# Patient Record
Sex: Female | Born: 2000 | Race: Black or African American | Hispanic: No | Marital: Single | Smoking: Former smoker
Health system: Southern US, Community
[De-identification: ages and names within clinical notes are randomized; demographics above are authoritative.]

## PROBLEM LIST (undated history)

## (undated) DIAGNOSIS — Z8619 Personal history of other infectious and parasitic diseases: Secondary | ICD-10-CM

## (undated) DIAGNOSIS — J45909 Unspecified asthma, uncomplicated: Secondary | ICD-10-CM

## (undated) DIAGNOSIS — A599 Trichomoniasis, unspecified: Secondary | ICD-10-CM

## (undated) DIAGNOSIS — J9801 Acute bronchospasm: Secondary | ICD-10-CM

## (undated) HISTORY — DX: Trichomoniasis, unspecified: A59.9

## (undated) HISTORY — DX: Acute bronchospasm: J98.01

## (undated) HISTORY — DX: Unspecified asthma, uncomplicated: J45.909

## (undated) HISTORY — DX: Personal history of other infectious and parasitic diseases: Z86.19

## (undated) HISTORY — PX: NO PAST SURGERIES: SHX2092

---
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
## 2020-01-01 ENCOUNTER — Ambulatory Visit (INDEPENDENT_AMBULATORY_CARE_PROVIDER_SITE_OTHER): Payer: BC Managed Care – PPO | Admitting: Obstetrics & Gynecology

## 2020-01-01 ENCOUNTER — Other Ambulatory Visit: Payer: Self-pay

## 2020-01-01 ENCOUNTER — Encounter: Payer: Self-pay | Admitting: Obstetrics & Gynecology

## 2020-01-01 ENCOUNTER — Ambulatory Visit (INDEPENDENT_AMBULATORY_CARE_PROVIDER_SITE_OTHER): Payer: BC Managed Care – PPO

## 2020-01-01 VITALS — BP 109/63 | HR 77 | Wt 109.0 lb

## 2020-01-01 DIAGNOSIS — Z3A32 32 weeks gestation of pregnancy: Secondary | ICD-10-CM

## 2020-01-01 DIAGNOSIS — Z3480 Encounter for supervision of other normal pregnancy, unspecified trimester: Secondary | ICD-10-CM

## 2020-01-01 DIAGNOSIS — O09899 Supervision of other high risk pregnancies, unspecified trimester: Secondary | ICD-10-CM

## 2020-01-01 DIAGNOSIS — O35EXX0 Maternal care for other (suspected) fetal abnormality and damage, fetal genitourinary anomalies, not applicable or unspecified: Secondary | ICD-10-CM

## 2020-01-01 DIAGNOSIS — Z1389 Encounter for screening for other disorder: Secondary | ICD-10-CM

## 2020-01-01 DIAGNOSIS — O358XX0 Maternal care for other (suspected) fetal abnormality and damage, not applicable or unspecified: Secondary | ICD-10-CM | POA: Diagnosis not present

## 2020-01-01 DIAGNOSIS — Z331 Pregnant state, incidental: Secondary | ICD-10-CM

## 2020-01-01 DIAGNOSIS — Z3483 Encounter for supervision of other normal pregnancy, third trimester: Secondary | ICD-10-CM

## 2020-01-01 LAB — POCT URINALYSIS DIPSTICK OB
Blood, UA: NEGATIVE
Glucose, UA: NEGATIVE
Ketones, UA: NEGATIVE
Nitrite, UA: NEGATIVE

## 2020-01-01 NOTE — Progress Notes (Signed)
LOW-RISK PREGNANCY VISIT Patient name: Briana Dominguez MRN [id number]  Date of birth: [birth year] Chief Complaint:   Routine Prenatal Visit  History of Present Illness:   Briana Dominguez is a 19 y.o. G34P1001 female at [redacted]w[redacted]d with an Estimated Date of Delivery: 02/20/20 being seen today for ongoing management of a low-risk pregnancy.  Depression screen [facility] 2/9 11/20/2019 08/16/2019 06/19/2018 04/11/2018  Decreased Interest 0 0 0 0  Down, Depressed, Hopeless 0 0 0 0  PHQ - 2 Score 0 0 0 0  Altered sleeping 0 0 1 -  Tired, decreased energy 1 3 1  -  Change in appetite 0 2 0 -  Feeling bad or failure about yourself  0 0 0 -  Trouble concentrating 0 0 0 -  Moving slowly or fidgety/restless 0 0 0 -  Suicidal thoughts 0 0 0 -  PHQ-9 Score 1 5 2  -  Difficult doing work/chores - - Not difficult at all -    Today she reports no complaints. Contractions: Not present. Vag. Bleeding: None.  Movement: Present. denies leaking of fluid. Review of Systems:   Pertinent items are noted in HPI Denies abnormal vaginal discharge w/ itching/odor/irritation, headaches, visual changes, shortness of breath, chest pain, abdominal pain, severe nausea/vomiting, or problems with urination or bowel movements unless otherwise stated above. Pertinent History Reviewed:  Reviewed past medical,surgical, social, obstetrical and family history.  Reviewed problem list, medications and allergies. Physical Assessment:   Vitals:   01/01/20 1635  BP: 109/63  Pulse: 77  Weight: 109 lb (49.4 kg)  Body mass index is 20.6 kg/m.        Physical Examination:   General appearance: Well appearing, and in no distress  Mental status: Alert, oriented to person, place, and time  Skin: Warm & dry  Cardiovascular: Normal heart rate noted  Respiratory: Normal respiratory effort, no distress  Abdomen: Soft, gravid, nontender  Pelvic: Cervical exam deferred         Extremities: Edema: None  Fetal Status:     Movement: Present     Chaperone: n/a    Results for orders placed or performed in visit on 01/01/20 (from the past 24 hour(s))  POC Urinalysis Dipstick OB   Collection Time: 01/01/20  4:40 PM  Result Value Ref Range   Color, UA     Clarity, UA     Glucose, UA Negative Negative   Bilirubin, UA     Ketones, UA negative    Spec Grav, UA     Blood, UA negative    pH, UA     POC,PROTEIN,UA Trace Negative, Trace, Small (1+), Moderate (2+), Large (3+), 4+   Urobilinogen, UA     Nitrite, UA neg    Leukocytes, UA Trace (A) Negative   Appearance     Odor      Assessment & Plan:  1) Low-risk pregnancy G2P1001 at [redacted]w[redacted]d with an Estimated Date of Delivery: 02/20/20   2) Duplicated right kidney, 1 ureter, mild dialtion with hydronephrosis, 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  Labs/procedures today: sonogram  Plan:  Continue routine obstetrical care  Next visit: prefers in person    Reviewed: Preterm labor symptoms and general obstetric precautions including but not limited to vaginal bleeding, contractions, leaking of fluid and fetal movement were reviewed in detail with the patient.  All questions were answered. Has home bp cuff. Rx faxed to . Check bp weekly, let [redacted]w[redacted]d know  if >140/90.   Follow-up: Return in about 3 weeks (around 01/22/2020) for LROB.  Orders Placed This Encounter  Procedures  . POC Urinalysis Dipstick OB    Lazaro Arms, MD 01/01/2020 5:13 PM

## 2020-01-01 NOTE — Progress Notes (Signed)
[name] 32+6 wks,cephalic,posterior placenta gr 1,duplicated right kidney with a dilated upper pole ureter 6 mm,no hydronephrosis,normal left kidney,fhr 137 bpm,AFI 11.6 cm,EFW 1967 g 28%

## 2020-01-23 ENCOUNTER — Ambulatory Visit (INDEPENDENT_AMBULATORY_CARE_PROVIDER_SITE_OTHER): Payer: BC Managed Care – PPO | Admitting: Medical

## 2020-01-23 ENCOUNTER — Other Ambulatory Visit: Payer: Self-pay

## 2020-01-23 ENCOUNTER — Other Ambulatory Visit (HOSPITAL_COMMUNITY)
Admission: RE | Admit: 2020-01-23 | Discharge: 2020-01-23 | Disposition: A | Discharge status: Home / Self Care | Payer: BC Managed Care – PPO | Source: Ambulatory Visit | Attending: Medical | Admitting: Medical

## 2020-01-23 VITALS — BP 118/54 | HR 80 | Wt 115.0 lb

## 2020-01-23 DIAGNOSIS — Z1389 Encounter for screening for other disorder: Secondary | ICD-10-CM

## 2020-01-23 DIAGNOSIS — O09899 Supervision of other high risk pregnancies, unspecified trimester: Secondary | ICD-10-CM

## 2020-01-23 DIAGNOSIS — Z3483 Encounter for supervision of other normal pregnancy, third trimester: Secondary | ICD-10-CM

## 2020-01-23 DIAGNOSIS — Z3A36 36 weeks gestation of pregnancy: Secondary | ICD-10-CM | POA: Insufficient documentation

## 2020-01-23 DIAGNOSIS — Z3480 Encounter for supervision of other normal pregnancy, unspecified trimester: Secondary | ICD-10-CM

## 2020-01-23 LAB — POCT URINALYSIS DIPSTICK OB
Blood, UA: NEGATIVE
Glucose, UA: NEGATIVE
Ketones, UA: NEGATIVE
Leukocytes, UA: NEGATIVE
Nitrite, UA: NEGATIVE
POC,PROTEIN,UA: NEGATIVE

## 2020-01-23 NOTE — Progress Notes (Signed)
   PRENATAL VISIT NOTE  Subjective:  Briana Dominguez is a 20 y.o. G2P1001 at [redacted]w[redacted]d being seen today for ongoing prenatal care.  She is currently monitored for the following issues for this low-risk pregnancy and has Vitamin D deficiency; Low vitamin B12 level; Gonorrhea; Encounter for supervision of normal pregnancy, antepartum; and Short interval between pregnancies affecting pregnancy, antepartum on their problem list.  Patient reports occasional contractions.  Contractions: Irregular. Vag. Bleeding: None.  Movement: Present. Denies leaking of fluid.   The following portions of the patient's history were reviewed and updated as appropriate: allergies, current medications, past family history, past medical history, past social history, past surgical history and problem list.   Objective:   Vitals:   01/23/20 0912  BP: (!) 118/54  Pulse: 80  Weight: 115 lb (52.2 kg)    Fetal Status: Fetal Heart Rate (bpm): 140 Fundal Height: 33 cm Movement: Present  Presentation: Vertex  General:  Alert, oriented and cooperative. Patient is in no acute distress.  Skin: Skin is warm and dry. No rash noted.   Cardiovascular: Normal heart rate noted  Respiratory: Normal respiratory effort, no problems with respiration noted  Abdomen: Soft, gravid, appropriate for gestational age.  Pain/Pressure: Present     Pelvic: Cervical exam performed in the presence of a chaperone Dilation: 3 Effacement (%): 50 Station: -2  Extremities: Normal range of motion.  Edema: None  Mental Status: Normal mood and affect. Normal behavior. Normal judgment and thought content.   Assessment and Plan:  Pregnancy: G2P1001 at [redacted]w[redacted]d 1. Encounter for supervision of other normal pregnancy in third trimester - Cervicovaginal ancillary only( [place]) - Culture, beta strep (group b only)  2. Screening for genitourinary condition - POC Urinalysis Dipstick OB  3. [redacted] weeks gestation of pregnancy  4. Short interval between  pregnancies affecting pregnancy, antepartum  Preterm labor symptoms and general obstetric precautions including but not limited to vaginal bleeding, contractions, leaking of fluid and fetal movement were reviewed in detail with the patient. Please refer to After Visit Summary for other counseling recommendations.   Return in about 1 week (around 01/30/2020) for LOB, In-Person.  No future appointments.  Vonzella Nipple, PA-C

## 2020-01-23 NOTE — Patient Instructions (Signed)
Fetal Movement Counts Patient Name: ________________________________________________ Patient Due Date: ____________________ What is a fetal movement count?  A fetal movement count is the number of times that you feel your baby move during a certain amount of time. This may also be called a fetal kick count. A fetal movement count is recommended for every pregnant woman. You may be asked to start counting fetal movements as early as week 28 of your pregnancy. Pay attention to when your baby is most active. You may notice your baby's sleep and wake cycles. You may also notice things that make your baby move more. You should do a fetal movement count:  When your baby is normally most active.  At the same time each day. A good time to count movements is while you are resting, after having something to eat and drink. How do I count fetal movements? 1. Find a quiet, comfortable area. Sit, or lie down on your side. 2. Write down the date, the start time and stop time, and the number of movements that you felt between those two times. Take this information with you to your health care visits. 3. Write down your start time when you feel the first movement. 4. Count kicks, flutters, swishes, rolls, and jabs. You should feel at least 10 movements. 5. You may stop counting after you have felt 10 movements, or if you have been counting for 2 hours. Write down the stop time. 6. If you do not feel 10 movements in 2 hours, contact your health care provider for further instructions. Your health care provider may want to do additional tests to assess your baby's well-being. Contact a health care provider if:  You feel fewer than 10 movements in 2 hours.  Your baby is not moving like he or she usually does. Date: ____________ Start time: ____________ Stop time: ____________ Movements: ____________ Date: ____________ Start time: ____________ Stop time: ____________ Movements: ____________ Date: ____________  Start time: ____________ Stop time: ____________ Movements: ____________ Date: ____________ Start time: ____________ Stop time: ____________ Movements: ____________ Date: ____________ Start time: ____________ Stop time: ____________ Movements: ____________ Date: ____________ Start time: ____________ Stop time: ____________ Movements: ____________ Date: ____________ Start time: ____________ Stop time: ____________ Movements: ____________ Date: ____________ Start time: ____________ Stop time: ____________ Movements: ____________ Date: ____________ Start time: ____________ Stop time: ____________ Movements: ____________ This information is not intended to replace advice given to you by your health care provider. Make sure you discuss any questions you have with your health care provider. Document Revised: 08/24/2018 Document Reviewed: 08/24/2018 Elsevier Patient Education  2020 [employer] Braxton Hicks Contractions Contractions of the uterus can occur throughout pregnancy, but they are not always a sign that you are in labor. You may have practice contractions called Braxton Hicks contractions. These false labor contractions are sometimes confused with true labor. What are Braxton Hicks contractions? Braxton Hicks contractions are tightening movements that occur in the muscles of the uterus before labor. Unlike true labor contractions, these contractions do not result in opening (dilation) and thinning of the cervix. Toward the end of pregnancy (32-34 weeks), Braxton Hicks contractions can happen more often and may become stronger. These contractions are sometimes difficult to tell apart from true labor because they can be very uncomfortable. You should not feel embarrassed if you go to the hospital with false labor. Sometimes, the only way to tell if you are in true labor is for your health care provider to look for changes in the cervix. The health care provider   will do a physical exam and may  monitor your contractions. If you are not in true labor, the exam should show that your cervix is not dilating and your water has not broken. If there are no other health problems associated with your pregnancy, it is completely safe for you to be sent home with false labor. You may continue to have Braxton Hicks contractions until you go into true labor. How to tell the difference between true labor and false labor True labor  Contractions last 30-70 seconds.  Contractions become very regular.  Discomfort is usually felt in the top of the uterus, and it spreads to the lower abdomen and low back.  Contractions do not go away with walking.  Contractions usually become more intense and increase in frequency.  The cervix dilates and gets thinner. False labor  Contractions are usually shorter and not as strong as true labor contractions.  Contractions are usually irregular.  Contractions are often felt in the front of the lower abdomen and in the groin.  Contractions may go away when you walk around or change positions while lying down.  Contractions get weaker and are shorter-lasting as time goes on.  The cervix usually does not dilate or become thin. Follow these instructions at home:   Take over-the-counter and prescription medicines only as told by your health care provider.  Keep up with your usual exercises and follow other instructions from your health care provider.  Eat and drink lightly if you think you are going into labor.  If Braxton Hicks contractions are making you uncomfortable: ? Change your position from lying down or resting to walking, or change from walking to resting. ? Sit and rest in a tub of warm water. ? Drink enough fluid to keep your urine pale yellow. Dehydration may cause these contractions. ? Do slow and deep breathing several times an hour.  Keep all follow-up prenatal visits as told by your health care provider. This is important. Contact a  health care provider if:  You have a fever.  You have continuous pain in your abdomen. Get help right away if:  Your contractions become stronger, more regular, and closer together.  You have fluid leaking or gushing from your vagina.  You pass blood-tinged mucus (bloody show).  You have bleeding from your vagina.  You have low back pain that you never had before.  You feel your baby's head pushing down and causing pelvic pressure.  Your baby is not moving inside you as much as it used to. Summary  Contractions that occur before labor are called Braxton Hicks contractions, false labor, or practice contractions.  Braxton Hicks contractions are usually shorter, weaker, farther apart, and less regular than true labor contractions. True labor contractions usually become progressively stronger and regular, and they become more frequent.  Manage discomfort from Braxton Hicks contractions by changing position, resting in a warm bath, drinking plenty of water, or practicing deep breathing. This information is not intended to replace advice given to you by your health care provider. Make sure you discuss any questions you have with your health care provider. Document Revised: 12/17/2016 Document Reviewed: 05/20/2016 Elsevier Patient Education  2020 [employer]  

## 2020-01-24 LAB — CERVICOVAGINAL ANCILLARY ONLY
Chlamydia: NEGATIVE
Comment: NEGATIVE
Comment: NORMAL
Neisseria Gonorrhea: NEGATIVE

## 2020-01-25 ENCOUNTER — Ambulatory Visit (INDEPENDENT_AMBULATORY_CARE_PROVIDER_SITE_OTHER): Payer: BC Managed Care – PPO | Admitting: Medical

## 2020-01-25 ENCOUNTER — Encounter: Payer: Self-pay | Admitting: Medical

## 2020-01-25 ENCOUNTER — Other Ambulatory Visit: Payer: Self-pay

## 2020-01-25 VITALS — BP 117/68 | HR 84 | Wt 116.0 lb

## 2020-01-25 DIAGNOSIS — Z3480 Encounter for supervision of other normal pregnancy, unspecified trimester: Secondary | ICD-10-CM

## 2020-01-25 DIAGNOSIS — Z1389 Encounter for screening for other disorder: Secondary | ICD-10-CM

## 2020-01-25 DIAGNOSIS — Z3A36 36 weeks gestation of pregnancy: Secondary | ICD-10-CM

## 2020-01-25 DIAGNOSIS — O09899 Supervision of other high risk pregnancies, unspecified trimester: Secondary | ICD-10-CM

## 2020-01-25 LAB — POCT URINALYSIS DIPSTICK OB
Blood, UA: NEGATIVE
Glucose, UA: NEGATIVE
Ketones, UA: NEGATIVE
Nitrite, UA: NEGATIVE
POC,PROTEIN,UA: NEGATIVE

## 2020-01-25 NOTE — Patient Instructions (Signed)
Fetal Movement Counts Patient Name: ________________________________________________ Patient Due Date: ____________________ What is a fetal movement count?  A fetal movement count is the number of times that you feel your baby move during a certain amount of time. This may also be called a fetal kick count. A fetal movement count is recommended for every pregnant woman. You may be asked to start counting fetal movements as early as week 28 of your pregnancy. Pay attention to when your baby is most active. You may notice your baby's sleep and wake cycles. You may also notice things that make your baby move more. You should do a fetal movement count:  When your baby is normally most active.  At the same time each day. A good time to count movements is while you are resting, after having something to eat and drink. How do I count fetal movements? 1. Find a quiet, comfortable area. Sit, or lie down on your side. 2. Write down the date, the start time and stop time, and the number of movements that you felt between those two times. Take this information with you to your health care visits. 3. Write down your start time when you feel the first movement. 4. Count kicks, flutters, swishes, rolls, and jabs. You should feel at least 10 movements. 5. You may stop counting after you have felt 10 movements, or if you have been counting for 2 hours. Write down the stop time. 6. If you do not feel 10 movements in 2 hours, contact your health care provider for further instructions. Your health care provider may want to do additional tests to assess your baby's well-being. Contact a health care provider if:  You feel fewer than 10 movements in 2 hours.  Your baby is not moving like he or she usually does. Date: ____________ Start time: ____________ Stop time: ____________ Movements: ____________ Date: ____________ Start time: ____________ Stop time: ____________ Movements: ____________ Date: ____________  Start time: ____________ Stop time: ____________ Movements: ____________ Date: ____________ Start time: ____________ Stop time: ____________ Movements: ____________ Date: ____________ Start time: ____________ Stop time: ____________ Movements: ____________ Date: ____________ Start time: ____________ Stop time: ____________ Movements: ____________ Date: ____________ Start time: ____________ Stop time: ____________ Movements: ____________ Date: ____________ Start time: ____________ Stop time: ____________ Movements: ____________ Date: ____________ Start time: ____________ Stop time: ____________ Movements: ____________ This information is not intended to replace advice given to you by your health care provider. Make sure you discuss any questions you have with your health care provider. Document Revised: 08/24/2018 Document Reviewed: 08/24/2018 Elsevier Patient Education  2020 [employer] Braxton Hicks Contractions Contractions of the uterus can occur throughout pregnancy, but they are not always a sign that you are in labor. You may have practice contractions called Braxton Hicks contractions. These false labor contractions are sometimes confused with true labor. What are Braxton Hicks contractions? Braxton Hicks contractions are tightening movements that occur in the muscles of the uterus before labor. Unlike true labor contractions, these contractions do not result in opening (dilation) and thinning of the cervix. Toward the end of pregnancy (32-34 weeks), Braxton Hicks contractions can happen more often and may become stronger. These contractions are sometimes difficult to tell apart from true labor because they can be very uncomfortable. You should not feel embarrassed if you go to the hospital with false labor. Sometimes, the only way to tell if you are in true labor is for your health care provider to look for changes in the cervix. The health care provider   will do a physical exam and may  monitor your contractions. If you are not in true labor, the exam should show that your cervix is not dilating and your water has not broken. If there are no other health problems associated with your pregnancy, it is completely safe for you to be sent home with false labor. You may continue to have Braxton Hicks contractions until you go into true labor. How to tell the difference between true labor and false labor True labor  Contractions last 30-70 seconds.  Contractions become very regular.  Discomfort is usually felt in the top of the uterus, and it spreads to the lower abdomen and low back.  Contractions do not go away with walking.  Contractions usually become more intense and increase in frequency.  The cervix dilates and gets thinner. False labor  Contractions are usually shorter and not as strong as true labor contractions.  Contractions are usually irregular.  Contractions are often felt in the front of the lower abdomen and in the groin.  Contractions may go away when you walk around or change positions while lying down.  Contractions get weaker and are shorter-lasting as time goes on.  The cervix usually does not dilate or become thin. Follow these instructions at home:   Take over-the-counter and prescription medicines only as told by your health care provider.  Keep up with your usual exercises and follow other instructions from your health care provider.  Eat and drink lightly if you think you are going into labor.  If Braxton Hicks contractions are making you uncomfortable: ? Change your position from lying down or resting to walking, or change from walking to resting. ? Sit and rest in a tub of warm water. ? Drink enough fluid to keep your urine pale yellow. Dehydration may cause these contractions. ? Do slow and deep breathing several times an hour.  Keep all follow-up prenatal visits as told by your health care provider. This is important. Contact a  health care provider if:  You have a fever.  You have continuous pain in your abdomen. Get help right away if:  Your contractions become stronger, more regular, and closer together.  You have fluid leaking or gushing from your vagina.  You pass blood-tinged mucus (bloody show).  You have bleeding from your vagina.  You have low back pain that you never had before.  You feel your baby's head pushing down and causing pelvic pressure.  Your baby is not moving inside you as much as it used to. Summary  Contractions that occur before labor are called Braxton Hicks contractions, false labor, or practice contractions.  Braxton Hicks contractions are usually shorter, weaker, farther apart, and less regular than true labor contractions. True labor contractions usually become progressively stronger and regular, and they become more frequent.  Manage discomfort from Braxton Hicks contractions by changing position, resting in a warm bath, drinking plenty of water, or practicing deep breathing. This information is not intended to replace advice given to you by your health care provider. Make sure you discuss any questions you have with your health care provider. Document Revised: 12/17/2016 Document Reviewed: 05/20/2016 Elsevier Patient Education  2020 [employer]  

## 2020-01-25 NOTE — Progress Notes (Signed)
   PRENATAL VISIT NOTE  Subjective:  Briana Dominguez is a 20 y.o. G2P1001 at [redacted]w[redacted]d being seen today for ongoing prenatal care.  She is currently monitored for the following issues for this low-risk pregnancy and has Vitamin D deficiency; Low vitamin B12 level; Gonorrhea; Encounter for supervision of normal pregnancy, antepartum; and Short interval between pregnancies affecting pregnancy, antepartum on their problem list.  Patient reports occasional contractions.  Contractions: Irregular. Vag. Bleeding: None.  Movement: Present. Denies leaking of fluid.   The following portions of the patient's history were reviewed and updated as appropriate: allergies, current medications, past family history, past medical history, past social history, past surgical history and problem list.   Objective:   Vitals:   01/25/20 1147  BP: 117/68  Pulse: 84  Weight: 116 lb (52.6 kg)    Fetal Status: Fetal Heart Rate (bpm): 132   Movement: Present  Presentation: Vertex  General:  Alert, oriented and cooperative. Patient is in no acute distress.  Skin: Skin is warm and dry. No rash noted.   Cardiovascular: Normal heart rate noted  Respiratory: Normal respiratory effort, no problems with respiration noted  Abdomen: Soft, gravid, appropriate for gestational age.  Pain/Pressure: Present     Pelvic: Cervical exam performed in the presence of a chaperone Dilation: 3 Effacement (%): 80 Station: -2  Extremities: Normal range of motion.  Edema: None  Mental Status: Normal mood and affect. Normal behavior. Normal judgment and thought content.   Assessment and Plan:  Pregnancy: G2P1001 at [redacted]w[redacted]d 1. Supervision of other normal pregnancy, antepartum - Seen earlier this week, but having more contractions and cramping - Cervix unchanged from last visit   2. Screening for genitourinary condition - POC Urinalysis [facility]  3. [redacted] weeks gestation of pregnancy - POC Urinalysis [facility]  4. Short interval between  pregnancies affecting pregnancy, antepartum  Preterm labor symptoms and general obstetric precautions including but not limited to vaginal bleeding, contractions, leaking of fluid and fetal movement were reviewed in detail with the patient. Please refer to After Visit Summary for other counseling recommendations.   Future Appointments  Date Time Provider Department Center  02/01/2020 10:10 AM Marny Lowenstein, PA-C [facility]    Vonzella Nipple, PA-C

## 2020-01-28 LAB — CULTURE, BETA STREP (GROUP B ONLY): Strep Gp B Culture: NEGATIVE

## 2020-02-01 ENCOUNTER — Ambulatory Visit (INDEPENDENT_AMBULATORY_CARE_PROVIDER_SITE_OTHER): Payer: BC Managed Care – PPO | Admitting: Medical

## 2020-02-01 ENCOUNTER — Other Ambulatory Visit: Payer: Self-pay

## 2020-02-01 ENCOUNTER — Encounter: Payer: Self-pay | Admitting: Medical

## 2020-02-01 VITALS — BP 111/74 | HR 83 | Wt 115.0 lb

## 2020-02-01 DIAGNOSIS — O09899 Supervision of other high risk pregnancies, unspecified trimester: Secondary | ICD-10-CM

## 2020-02-01 DIAGNOSIS — Z3480 Encounter for supervision of other normal pregnancy, unspecified trimester: Secondary | ICD-10-CM

## 2020-02-01 MED ORDER — PROMETHAZINE HCL 25 MG PO TABS
25.0000 mg | ORAL_TABLET | Freq: Three times a day (TID) | ORAL | 0 refills | Status: DC | PRN
Start: 2020-02-01 — End: 2020-02-15

## 2020-02-01 NOTE — Progress Notes (Signed)
   PRENATAL VISIT NOTE  Subjective:  Briana Dominguez is a 20 y.o. G2P1001 at [redacted]w[redacted]d being seen today for ongoing prenatal care.  She is currently monitored for the following issues for this low-risk pregnancy and has Vitamin D deficiency; Low vitamin B12 level; Gonorrhea; Encounter for supervision of normal pregnancy, antepartum; and Short interval between pregnancies affecting pregnancy, antepartum on their problem list.  Patient reports backache and occasional contractions.  Contractions: Irregular. Vag. Bleeding: None.  Movement: Present. Denies leaking of fluid.   The following portions of the patient's history were reviewed and updated as appropriate: allergies, current medications, past family history, past medical history, past social history, past surgical history and problem list.   Objective:   Vitals:   02/01/20 1018  BP: 111/74  Pulse: 83  Weight: 115 lb (52.2 kg)    Fetal Status: Fetal Heart Rate (bpm): 133 Fundal Height: 36 cm Movement: Present  Presentation: Vertex  General:  Alert, oriented and cooperative. Patient is in no acute distress.  Skin: Skin is warm and dry. No rash noted.   Cardiovascular: Normal heart rate noted  Respiratory: Normal respiratory effort, no problems with respiration noted  Abdomen: Soft, gravid, appropriate for gestational age.  Pain/Pressure: Present     Pelvic: Cervical exam performed in the presence of a chaperone Dilation: 3 Effacement (%): 80 Station: -2  Extremities: Normal range of motion.  Edema: None  Mental Status: Normal mood and affect. Normal behavior. Normal judgment and thought content.   Assessment and Plan:  Pregnancy: G2P1001 at [redacted]w[redacted]d 1. Supervision of other normal pregnancy, antepartum - promethazine (PHENERGAN) 25 MG tablet; Take 1 tablet (25 mg total) by mouth every 8 (eight) hours as needed for nausea or vomiting.  Dispense: 30 tablet; Refill: 0 - Advised heat and hydrotherapy for back pain and contractions - Tylenol  PRN for pain - May want to consider IOL 39-40 weeks, advised this can be discussed further at next visit  - Negative GBS and GC/Chlamydia discussed   2. Short interval between pregnancies affecting pregnancy, antepartum  Term labor symptoms and general obstetric precautions including but not limited to vaginal bleeding, contractions, leaking of fluid and fetal movement were reviewed in detail with the patient. Please refer to After Visit Summary for other counseling recommendations.   Return in about 1 week (around 02/08/2020) for LOB, In-Person.  No future appointments.  Vonzella Nipple, PA-C

## 2020-02-01 NOTE — Patient Instructions (Signed)
Fetal Movement Counts Patient Name: ________________________________________________ Patient Due Date: ____________________  What is a fetal movement count? A fetal movement count is the number of times that you feel your baby move during a certain amount of time. This may also be called a fetal kick count. A fetal movement count is recommended for every pregnant woman. You may be asked to start counting fetal movements as early as week 28 of your pregnancy. Pay attention to when your baby is most active. You may notice your baby's sleep and wake cycles. You may also notice things that make your baby move more. You should do a fetal movement count:  When your baby is normally most active.  At the same time each day. A good time to count movements is while you are resting, after having something to eat and drink. How do I count fetal movements? 1. Find a quiet, comfortable area. Sit, or lie down on your side. 2. Write down the date, the start time and stop time, and the number of movements that you felt between those two times. Take this information with you to your health care visits. 3. Write down your start time when you feel the first movement. 4. Count kicks, flutters, swishes, rolls, and jabs. You should feel at least 10 movements. 5. You may stop counting after you have felt 10 movements, or if you have been counting for 2 hours. Write down the stop time. 6. If you do not feel 10 movements in 2 hours, contact your health care provider for further instructions. Your health care provider may want to do additional tests to assess your baby's well-being. Contact a health care provider if:  You feel fewer than 10 movements in 2 hours.  Your baby is not moving like he or she usually does. Date: ____________ Start time: ____________ Stop time: ____________ Movements: ____________ Date: ____________ Start time: ____________ Stop time: ____________ Movements: ____________ Date: ____________  Start time: ____________ Stop time: ____________ Movements: ____________ Date: ____________ Start time: ____________ Stop time: ____________ Movements: ____________ Date: ____________ Start time: ____________ Stop time: ____________ Movements: ____________ Date: ____________ Start time: ____________ Stop time: ____________ Movements: ____________ Date: ____________ Start time: ____________ Stop time: ____________ Movements: ____________ Date: ____________ Start time: ____________ Stop time: ____________ Movements: ____________ Date: ____________ Start time: ____________ Stop time: ____________ Movements: ____________ This information is not intended to replace advice given to you by your health care provider. Make sure you discuss any questions you have with your health care provider. Document Revised: 08/24/2018 Document Reviewed: 08/24/2018 Elsevier Patient Education  2021 [employer] Inc. Rosen's Emergency Medicine: Concepts and Clinical Practice (9th ed., pp. 2296- 2312). Elsevier.">    Braxton Hicks Contractions Contractions of the uterus can occur throughout pregnancy, but they are not always a sign that you are in labor. You may have practice contractions called Braxton Hicks contractions. These false labor contractions are sometimes confused with true labor. What are Braxton Hicks contractions? Braxton Hicks contractions are tightening movements that occur in the muscles of the uterus before labor. Unlike true labor contractions, these contractions do not result in opening (dilation) and thinning of the cervix. Toward the end of pregnancy (32-34 weeks), Braxton Hicks contractions can happen more often and may become stronger. These contractions are sometimes difficult to tell apart from true labor because they can be very uncomfortable. You should not feel embarrassed if you go to the hospital with false labor. Sometimes, the only way to tell if you are in true labor is   for your health care  provider to look for changes in the cervix. The health care provider will do a physical exam and may monitor your contractions. If you are not in true labor, the exam should show that your cervix is not dilating and your water has not broken. If there are no other health problems associated with your pregnancy, it is completely safe for you to be sent home with false labor. You may continue to have Braxton Hicks contractions until you go into true labor. How to tell the difference between true labor and false labor True labor  Contractions last 30-70 seconds.  Contractions become very regular.  Discomfort is usually felt in the top of the uterus, and it spreads to the lower abdomen and low back.  Contractions do not go away with walking.  Contractions usually become more intense and increase in frequency.  The cervix dilates and gets thinner. False labor  Contractions are usually shorter and not as strong as true labor contractions.  Contractions are usually irregular.  Contractions are often felt in the front of the lower abdomen and in the groin.  Contractions may go away when you walk around or change positions while lying down.  Contractions get weaker and are shorter-lasting as time goes on.  The cervix usually does not dilate or become thin. Follow these instructions at home:  Take over-the-counter and prescription medicines only as told by your health care provider.  Keep up with your usual exercises and follow other instructions from your health care provider.  Eat and drink lightly if you think you are going into labor.  If Braxton Hicks contractions are making you uncomfortable: ? Change your position from lying down or resting to walking, or change from walking to resting. ? Sit and rest in a tub of warm water. ? Drink enough fluid to keep your urine pale yellow. Dehydration may cause these contractions. ? Do slow and deep breathing several times an hour.  Keep  all follow-up prenatal visits as told by your health care provider. This is important.   Contact a health care provider if:  You have a fever.  You have continuous pain in your abdomen. Get help right away if:  Your contractions become stronger, more regular, and closer together.  You have fluid leaking or gushing from your vagina.  You pass blood-tinged mucus (bloody show).  You have bleeding from your vagina.  You have low back pain that you never had before.  You feel your baby's head pushing down and causing pelvic pressure.  Your baby is not moving inside you as much as it used to. Summary  Contractions that occur before labor are called Braxton Hicks contractions, false labor, or practice contractions.  Braxton Hicks contractions are usually shorter, weaker, farther apart, and less regular than true labor contractions. True labor contractions usually become progressively stronger and regular, and they become more frequent.  Manage discomfort from Braxton Hicks contractions by changing position, resting in a warm bath, drinking plenty of water, or practicing deep breathing. This information is not intended to replace advice given to you by your health care provider. Make sure you discuss any questions you have with your health care provider. Document Revised: 12/17/2016 Document Reviewed: 05/20/2016 Elsevier Patient Education  2021 [employer] Inc.  

## 2020-02-06 ENCOUNTER — Other Ambulatory Visit: Payer: Self-pay

## 2020-02-06 ENCOUNTER — Ambulatory Visit (INDEPENDENT_AMBULATORY_CARE_PROVIDER_SITE_OTHER): Payer: BC Managed Care – PPO | Admitting: Obstetrics and Gynecology

## 2020-02-06 ENCOUNTER — Encounter: Payer: Self-pay | Admitting: Obstetrics and Gynecology

## 2020-02-06 VITALS — BP 115/71 | HR 93 | Wt 115.4 lb

## 2020-02-06 DIAGNOSIS — Z3480 Encounter for supervision of other normal pregnancy, unspecified trimester: Secondary | ICD-10-CM

## 2020-02-06 DIAGNOSIS — A549 Gonococcal infection, unspecified: Secondary | ICD-10-CM

## 2020-02-06 NOTE — Patient Instructions (Signed)

## 2020-02-06 NOTE — Progress Notes (Signed)
Subjective:  Briana Dominguez is a 20 y.o. G2P1001 at [redacted]w[redacted]d being seen today for ongoing prenatal care.  She is currently monitored for the following issues for this low-risk pregnancy and has Vitamin D deficiency; Low vitamin B12 level; Gonorrhea; Encounter for supervision of normal pregnancy, antepartum; and Short interval between pregnancies affecting pregnancy, antepartum on their problem list.  Patient reports a gush of fluid last evening..  Contractions: Not present. Vag. Bleeding: None.  Movement: Present. Denies leaking of fluid.   The following portions of the patient's history were reviewed and updated as appropriate: allergies, current medications, past family history, past medical history, past social history, past surgical history and problem list. Problem list updated.  Objective:   Vitals:   02/06/20 1037  BP: 115/71  Pulse: 93  Weight: 115 lb 6.4 oz (52.3 kg)    Fetal Status:     Movement: Present     General:  Alert, oriented and cooperative. Patient is in no acute distress.  Skin: Skin is warm and dry. No rash noted.   Cardiovascular: Normal heart rate noted  Respiratory: Normal respiratory effort, no problems with respiration noted  Abdomen: Soft, gravid, appropriate for gestational age. Pain/Pressure: Present     Pelvic:  Cervical exam performed      No Pooling, normal pregnancy discharge, fern negative  Extremities: Normal range of motion.  Edema: None  Mental Status: Normal mood and affect. Normal behavior. Normal judgment and thought content.   Urinalysis:      Assessment and Plan:  Pregnancy: G2P1001 at [redacted]w[redacted]d  1. Supervision of other normal pregnancy, antepartum Labor precautions  2. Gonorrhea   Term labor symptoms and general obstetric precautions including but not limited to vaginal bleeding, contractions, leaking of fluid and fetal movement were reviewed in detail with the patient. Please refer to After Visit Summary for other counseling  recommendations.  Return in about 1 week (around 02/13/2020) for OB visit, face to face, any provider.   Hermina Staggers, MD

## 2020-02-13 ENCOUNTER — Inpatient Hospital Stay (HOSPITAL_COMMUNITY)
Admission: AD | Admit: 2020-02-13 | Discharge: 2020-02-15 | DRG: 807 | Disposition: A | Discharge status: Home / Self Care | Payer: BC Managed Care – PPO | Attending: Obstetrics & Gynecology | Admitting: Obstetrics & Gynecology

## 2020-02-13 ENCOUNTER — Ambulatory Visit (INDEPENDENT_AMBULATORY_CARE_PROVIDER_SITE_OTHER): Payer: BC Managed Care – PPO | Admitting: Advanced Practice Midwife

## 2020-02-13 ENCOUNTER — Other Ambulatory Visit: Payer: BC Managed Care – PPO

## 2020-02-13 ENCOUNTER — Other Ambulatory Visit: Payer: Self-pay

## 2020-02-13 ENCOUNTER — Encounter (HOSPITAL_COMMUNITY): Payer: Self-pay | Admitting: Emergency Medicine

## 2020-02-13 VITALS — BP 118/69 | HR 96 | Wt 116.8 lb

## 2020-02-13 DIAGNOSIS — O9902 Anemia complicating childbirth: Secondary | ICD-10-CM | POA: Diagnosis present

## 2020-02-13 DIAGNOSIS — D649 Anemia, unspecified: Secondary | ICD-10-CM | POA: Diagnosis present

## 2020-02-13 DIAGNOSIS — Z20822 Contact with and (suspected) exposure to covid-19: Secondary | ICD-10-CM | POA: Diagnosis not present

## 2020-02-13 DIAGNOSIS — O358XX0 Maternal care for other (suspected) fetal abnormality and damage, not applicable or unspecified: Secondary | ICD-10-CM | POA: Diagnosis not present

## 2020-02-13 DIAGNOSIS — Z87891 Personal history of nicotine dependence: Secondary | ICD-10-CM | POA: Diagnosis not present

## 2020-02-13 DIAGNOSIS — Z3A39 39 weeks gestation of pregnancy: Secondary | ICD-10-CM

## 2020-02-13 DIAGNOSIS — O9903 Anemia complicating the puerperium: Secondary | ICD-10-CM | POA: Diagnosis not present

## 2020-02-13 DIAGNOSIS — J45909 Unspecified asthma, uncomplicated: Secondary | ICD-10-CM | POA: Diagnosis not present

## 2020-02-13 DIAGNOSIS — Z3480 Encounter for supervision of other normal pregnancy, unspecified trimester: Secondary | ICD-10-CM

## 2020-02-13 DIAGNOSIS — O09899 Supervision of other high risk pregnancies, unspecified trimester: Secondary | ICD-10-CM

## 2020-02-13 DIAGNOSIS — O99019 Anemia complicating pregnancy, unspecified trimester: Secondary | ICD-10-CM | POA: Diagnosis present

## 2020-02-13 DIAGNOSIS — O9952 Diseases of the respiratory system complicating childbirth: Secondary | ICD-10-CM | POA: Diagnosis not present

## 2020-02-13 DIAGNOSIS — O26893 Other specified pregnancy related conditions, third trimester: Secondary | ICD-10-CM | POA: Diagnosis not present

## 2020-02-13 LAB — CBG MONITORING, ED: Glucose-Capillary: 83 mg/dL (ref 70–99)

## 2020-02-13 LAB — SARS CORONAVIRUS 2 BY RT PCR (HOSPITAL ORDER, PERFORMED IN ~~LOC~~ HOSPITAL LAB): SARS Coronavirus 2: NEGATIVE

## 2020-02-13 MED ORDER — TETANUS-DIPHTH-ACELL PERTUSSIS 5-2.5-18.5 LF-MCG/0.5 IM SUSY: 0.5000 mL | PREFILLED_SYRINGE | Freq: Once | INTRAMUSCULAR | Status: DC

## 2020-02-13 MED ORDER — MORPHINE SULFATE (PF) 4 MG/ML IV SOLN
2.0000 mg | Freq: Once | INTRAVENOUS | Status: AC
  Administered 2020-02-13: 2 mg via INTRAVENOUS
  Filled 2020-02-13: qty 1

## 2020-02-13 MED ORDER — DOCUSATE SODIUM 100 MG PO CAPS
100.0000 mg | ORAL_CAPSULE | Freq: Two times a day (BID) | ORAL | Status: DC
  Administered 2020-02-14: 100 mg via ORAL
  Filled 2020-02-13 (×2): qty 1

## 2020-02-13 MED ORDER — SODIUM CHLORIDE 0.9 % IV BOLUS
1000.0000 mL | Freq: Once | INTRAVENOUS | Status: AC
  Administered 2020-02-13: 1000 mL via INTRAVENOUS

## 2020-02-13 MED ORDER — SIMETHICONE 80 MG PO CHEW
80.0000 mg | CHEWABLE_TABLET | ORAL | Status: DC | PRN
  Administered 2020-02-14: 80 mg via ORAL
  Filled 2020-02-13: qty 1

## 2020-02-13 MED ORDER — COCONUT OIL OIL: 1.0000 "application " | TOPICAL_OIL | Status: DC | PRN

## 2020-02-13 MED ORDER — BENZOCAINE-MENTHOL 20-0.5 % EX AERO
1.0000 "application " | INHALATION_SPRAY | CUTANEOUS | Status: DC | PRN
  Administered 2020-02-14: 1 via TOPICAL
  Filled 2020-02-13: qty 56

## 2020-02-13 MED ORDER — ACETAMINOPHEN 325 MG PO TABS
650.0000 mg | ORAL_TABLET | ORAL | Status: DC | PRN
  Administered 2020-02-14 – 2020-02-15 (×3): 650 mg via ORAL
  Filled 2020-02-13 (×3): qty 2

## 2020-02-13 MED ORDER — MEASLES, MUMPS & RUBELLA VAC IJ SOLR: 0.5000 mL | Freq: Once | INTRAMUSCULAR | Status: DC

## 2020-02-13 MED ORDER — TERCONAZOLE 0.4 % VA CREA: 1.0000 | TOPICAL_CREAM | Freq: Every day | VAGINAL | 0 refills | Status: DC

## 2020-02-13 MED ORDER — IBUPROFEN 600 MG PO TABS
600.0000 mg | ORAL_TABLET | Freq: Four times a day (QID) | ORAL | Status: DC
  Administered 2020-02-14 – 2020-02-15 (×7): 600 mg via ORAL
  Filled 2020-02-13 (×7): qty 1

## 2020-02-13 MED ORDER — PRENATAL MULTIVITAMIN CH
1.0000 | ORAL_TABLET | Freq: Every day | ORAL | Status: DC
  Administered 2020-02-14: 1 via ORAL
  Filled 2020-02-13: qty 1

## 2020-02-13 MED ORDER — MEDROXYPROGESTERONE ACETATE 150 MG/ML IM SUSP
150.0000 mg | INTRAMUSCULAR | Status: AC | PRN
  Administered 2020-02-15: 150 mg via INTRAMUSCULAR
  Filled 2020-02-13: qty 1

## 2020-02-13 MED ORDER — DIBUCAINE (PERIANAL) 1 % EX OINT: 1.0000 "application " | TOPICAL_OINTMENT | CUTANEOUS | Status: DC | PRN

## 2020-02-13 MED ORDER — ONDANSETRON HCL 4 MG PO TABS: 4.0000 mg | ORAL_TABLET | ORAL | Status: DC | PRN

## 2020-02-13 MED ORDER — WITCH HAZEL-GLYCERIN EX PADS: 1.0000 "application " | MEDICATED_PAD | CUTANEOUS | Status: DC | PRN

## 2020-02-13 MED ORDER — SENNOSIDES-DOCUSATE SODIUM 8.6-50 MG PO TABS
2.0000 | ORAL_TABLET | ORAL | Status: DC
  Administered 2020-02-14: 2 via ORAL
  Filled 2020-02-13: qty 2

## 2020-02-13 MED ORDER — ONDANSETRON HCL 4 MG/2ML IJ SOLN: 4.0000 mg | INTRAMUSCULAR | Status: DC | PRN

## 2020-02-13 MED ORDER — DIPHENHYDRAMINE HCL 25 MG PO CAPS: 25.0000 mg | ORAL_CAPSULE | Freq: Four times a day (QID) | ORAL | Status: DC | PRN

## 2020-02-13 NOTE — Patient Instructions (Signed)
Try these positions to possible help with labor:   [email or web address]   Rosen's Emergency Medicine: Concepts and Clinical Practice (9th ed., pp. 2296- 2312). Elsevier.">  Braxton Hicks Contractions Contractions of the uterus can occur throughout pregnancy, but they are not always a sign that you are in labor. You may have practice contractions called Braxton Hicks contractions. These false labor contractions are sometimes confused with true labor. What are Deberah Pelton contractions? Braxton Hicks contractions are tightening movements that occur in the muscles of the uterus before labor. Unlike true labor contractions, these contractions do not result in opening (dilation) and thinning of the cervix. Toward the end of pregnancy (32-34 weeks), Braxton Hicks contractions can happen more often and may become stronger. These contractions are sometimes difficult to tell apart from true labor because they can be very uncomfortable. You should not feel embarrassed if you go to the hospital with false labor. Sometimes, the only way to tell if you are in true labor is for your health care provider to look for changes in the cervix. The health care provider will do a physical exam and may monitor your contractions. If you are not in true labor, the exam should show that your cervix is not dilating and your water has not broken. If there are no other health problems associated with your pregnancy, it is completely safe for you to be sent home with false labor. You may continue to have Braxton Hicks contractions until you go into true labor. How to tell the difference between true labor and false labor True labor  Contractions last 30-70 seconds.  Contractions become very regular.  Discomfort is usually felt in the top of the uterus, and it spreads to the lower abdomen and low back.  Contractions do not go away with walking.  Contractions usually become more intense and increase in frequency.  The  cervix dilates and gets thinner. False labor  Contractions are usually shorter and not as strong as true labor contractions.  Contractions are usually irregular.  Contractions are often felt in the front of the lower abdomen and in the groin.  Contractions may go away when you walk around or change positions while lying down.  Contractions get weaker and are shorter-lasting as time goes on.  The cervix usually does not dilate or become thin. Follow these instructions at home:  Take over-the-counter and prescription medicines only as told by your health care provider.  Keep up with your usual exercises and follow other instructions from your health care provider.  Eat and drink lightly if you think you are going into labor.  If Braxton Hicks contractions are making you uncomfortable: ? Change your position from lying down or resting to walking, or change from walking to resting. ? Sit and rest in a tub of warm water. ? Drink enough fluid to keep your urine pale yellow. Dehydration may cause these contractions. ? Do slow and deep breathing several times an hour.  Keep all follow-up prenatal visits as told by your health care provider. This is important.   Contact a health care provider if:  You have a fever.  You have continuous pain in your abdomen. Get help right away if:  Your contractions become stronger, more regular, and closer together.  You have fluid leaking or gushing from your vagina.  You pass blood-tinged mucus (bloody show).  You have bleeding from your vagina.  You have low back pain that you never had before.  You feel your baby's head  pushing down and causing pelvic pressure.  Your baby is not moving inside you as much as it used to. Summary  Contractions that occur before labor are called Braxton Hicks contractions, false labor, or practice contractions.  Braxton Hicks contractions are usually shorter, weaker, farther apart, and less regular than  true labor contractions. True labor contractions usually become progressively stronger and regular, and they become more frequent.  Manage discomfort from [facility] contractions by changing position, resting in a warm bath, drinking plenty of water, or practicing deep breathing. This information is not intended to replace advice given to you by your health care provider. Make sure you discuss any questions you have with your health care provider. Document Revised: 12/17/2016 Document Reviewed: 05/20/2016 Elsevier Patient Education  2021 [employer].

## 2020-02-13 NOTE — ED Provider Notes (Signed)
[facility] EMERGENCY DEPARTMENT Provider Note   CSN: [id number] Arrival date & time: 02/13/20  2016     History Chief Complaint  Patient presents with  . Laboring    Briana Dominguez is a 20 y.o. female.  HPI   This patient is a 20 year old female, she is G2, P1 at approximately [redacted] weeks gestation, she was at the OB/GYN office today and had her "membrane stripped".  The patient reports that starting at approximately 6:00 PM she started to have some feeling of pressure in her abdomen and felt like she was contracting, this became worse and very quickly escalated but she comes to the emergency department.  She has had some bleeding, she has no nausea or vomiting, her abdomen pain is intermittent, severe at times and feels similar to her prior contractions with prior pregnancy.  There has been no nausea or vomiting, no fevers or chills, symptoms are intermittent, severe at this time.  The patient is in the car when I evaluate her in the ambulance entrance  Past Medical History:  Diagnosis Date  . Asthma   . Bronchospasm   . History of chlamydia   . History of gonorrhea   . Trichimoniasis 05/09/2018   Treated 4/21 with flagyl, POC___     Patient Active Problem List   Diagnosis Date Noted  . Encounter for supervision of normal pregnancy, antepartum 08/16/2019  . Short interval between pregnancies affecting pregnancy, antepartum 08/16/2019  . Vitamin D deficiency 03/10/2017  . Low vitamin B12 level 03/10/2017  . Gonorrhea 03/10/2017    Past Surgical History:  Procedure Laterality Date  . NO PAST SURGERIES       OB History    Gravida  2   Para  1   Term  1   Preterm      AB      Living  1     SAB      IAB      Ectopic      Multiple  0   Live Births  1           Family History  Problem Relation Age of Onset  . Healthy Mother   . Healthy Father     Social History   Tobacco Use  . Smoking status: Former Smoker    Types: Cigars    Quit date: 2019     Years since quitting: 3.0  . Smokeless tobacco: Never Used  . Tobacco comment: black & mild  Vaping Use  . Vaping Use: Former  Substance Use Topics  . Alcohol use: No  . Drug use: Not Currently    Home Medications Prior to Admission medications   Medication Sig Start Date End Date Taking? Authorizing Provider  acetaminophen (TYLENOL) 325 MG tablet Take 2 tablets (650 mg total) by mouth every 4 (four) hours as needed (for pain scale < 4). 12/16/18   Dana Allan, MD  albuterol (VENTOLIN HFA) 108 (90 Base) MCG/ACT inhaler Inhale 1-2 puffs into the lungs every 6 (six) hours as needed for wheezing or shortness of breath.     [provider]  Blood Pressure Monitor MISC For regular home bp monitoring during pregnancy 06/19/18   Cheral Marker, CNM  ferrous sulfate 325 (65 FE) MG tablet Take 1 tablet (325 mg total) by mouth 2 (two) times daily with a meal. 11/22/19   Cheral Marker, CNM  Prenatal Vit-Fe Fumarate-FA (PRENATAL VITAMINS) 28-0.8 MG TABS Take by mouth.    [provider]  promethazine (PHENERGAN) 25 MG tablet Take 1 tablet (25 mg total) by mouth every 8 (eight) hours as needed for nausea or vomiting. 02/01/20   Marny Lowenstein, PA-C  terconazole (TERAZOL 7) 0.4 % vaginal cream Place 1 applicator vaginally at bedtime. 02/13/20   Arabella Merles, CNM    Allergies    Patient has no known allergies.  Review of Systems   Review of Systems  All other systems reviewed and are negative.   Physical Exam Updated Vital Signs BP 108/67   Pulse 80   Resp (!) 27   Ht 1.549 m (5\' 1" )   Wt 53 kg   LMP  (LMP Unknown)   SpO2 97%   BMI 22.07 kg/m   Physical Exam Vitals and nursing note reviewed.  Constitutional:      General: She is in acute distress.     Appearance: She is well-developed and well-nourished.  HENT:     Head: Normocephalic and atraumatic.     Mouth/Throat:     Mouth: Oropharynx is clear and moist.     Pharynx: No oropharyngeal exudate.   Eyes:     General: No scleral icterus.       Right eye: No discharge.        Left eye: No discharge.     Extraocular Movements: EOM normal.     Conjunctiva/sclera: Conjunctivae normal.     Pupils: Pupils are equal, round, and reactive to light.  Neck:     Thyroid: No thyromegaly.     Vascular: No JVD.  Cardiovascular:     Rate and Rhythm: Normal rate and regular rhythm.     Pulses: Intact distal pulses.     Heart sounds: Normal heart sounds. No murmur heard. No friction rub. No gallop.   Pulmonary:     Effort: No respiratory distress.     Breath sounds: Normal breath sounds. No wheezing or rales.     Comments: Tachypneic Abdominal:     General: Bowel sounds are normal. There is no distension.     Palpations: Abdomen is soft. There is no mass.     Tenderness: There is no abdominal tenderness.  Genitourinary:    Comments: Chaperone present for exam, the patient is crowning, amniotic sac intact Musculoskeletal:        General: No tenderness or edema. Normal range of motion.     Cervical back: Normal range of motion and neck supple.  Lymphadenopathy:     Cervical: No cervical adenopathy.  Skin:    General: Skin is warm and dry.     Coloration: Skin is pale.     Findings: No erythema or rash.  Neurological:     General: No focal deficit present.     Mental Status: She is alert.     Coordination: Coordination normal.  Psychiatric:        Mood and Affect: Mood and affect normal.        Behavior: Behavior normal.     ED Results / Procedures / Treatments   Labs (all labs ordered are listed, but only abnormal results are displayed) Labs Reviewed  SARS CORONAVIRUS 2 BY RT PCR (HOSPITAL ORDER, PERFORMED IN [facility])    EKG None  Radiology No results found.  Procedures Procedures   Vaginal delivery of a fetus:  This patient was in the supine position, she had need for emergent delivery of a fetus, actively contracting.  She gave me verbal consent to  assist with delivery of the fetus.  The patient was crowning, within 2 pushes the patient was able to expel the fetus with gentle manipulation of the fetus's arms which were in appropriate position, this was a headfirst delivery, there was a moderate amount of bleeding associated with the delivery which seems to slow, placenta in place after delivery, gentle traction placed to deliver placenta.  There was no obvious tear in the vaginal tissue  Medications Ordered in ED Medications - No data to display  ED Course  I have reviewed the triage vital signs and the nursing notes.  Pertinent labs & imaging results that were available during my care of the patient were reviewed by me and considered in my medical decision making (see chart for details).    MDM Rules/Calculators/A&P                          Please see separate note for delivery of healthy fetus.  This patient will be discussed with OB/GYN for transfer.  The patient pushed twice and ultimately was able to deliver what appears to be a healthy baby girl.  The umbilical cord was clamped and gentle pressure was held to assist with delivery of placenta.  I discussed the case with Dr. Debroah Loop, he has accepted the patient to the maternal admissions unit.  He does not request any additional labs or stabilizing care.  I will add a bag of normal saline for fluids as needed, the patient is not nauseated  Final Clinical Impression(s) / ED Diagnoses Final diagnoses:  Vaginal delivery      Eber Hong, MD 02/13/20 2049

## 2020-02-13 NOTE — ED Notes (Signed)
Baby girl delievered at 2022. 7lbs 5.6 oz.

## 2020-02-13 NOTE — Progress Notes (Signed)
   LOW-RISK PREGNANCY VISIT Patient name: Briana Dominguez MRN [id number]  Date of birth: 2000-06-08 Chief Complaint:   Routine Prenatal Visit  History of Present Illness:   Briana Dominguez is a 20 y.o. G12P1001 female at [redacted]w[redacted]d with an Estimated Date of Delivery: 02/20/20 being seen today for ongoing management of a low-risk pregnancy.  Today she reports leg cramps, abd cramps, ext vag itching; H/A not always relieved by Tylenol- is drinking approx 16oz of water qd. Contractions: Irregular. Vag. Bleeding: None.  Movement: Present. denies leaking of fluid. Review of Systems:   Pertinent items are noted in HPI Denies abnormal vaginal discharge w/ itching/odor/irritation, headaches, visual changes, shortness of breath, chest pain, abdominal pain, severe nausea/vomiting, or problems with urination or bowel movements unless otherwise stated above. Pertinent History Reviewed:  Reviewed past medical,surgical, social, obstetrical and family history.  Reviewed problem list, medications and allergies. Physical Assessment:   Vitals:   02/13/20 1342  BP: 118/69  Pulse: 96  Weight: 116 lb 12.8 oz (53 kg)  Body mass index is 22.07 kg/m.        Physical Examination:   General appearance: Well appearing, and in no distress  Mental status: Alert, oriented to person, place, and time  Skin: Warm & dry  Cardiovascular: Normal heart rate noted  Respiratory: Normal respiratory effort, no distress  Abdomen: Soft, gravid, nontender  Pelvic: Cervical exam performed  Dilation: 3 Effacement (%): 80 Station: -2  Extremities: Edema: None  Fetal Status: Fetal Heart Rate (bpm): 137   Movement: Present Presentation: Vertex  No results found for this or any previous visit (from the past 24 hour(s)).  Assessment & Plan:  1) Low-risk pregnancy G2P1001 at [redacted]w[redacted]d with an Estimated Date of Delivery: 02/20/20   2) Headaches, rec increase water intake to at least 64oz qd  3) Yeast vag d/c, rx Terazol, may use some  externally as well   Meds:  Meds ordered this encounter  Medications  . terconazole (TERAZOL 7) 0.4 % vaginal cream    Sig: Place 1 applicator vaginally at bedtime.    Dispense:  45 g    Refill:  0    Order Specific Question:   Supervising Provider    Answer:   Lazaro Arms [[id number]]   Labs/procedures today: SVE  Plan:  Continue routine obstetrical care with testing at 40.1wks and plan for IOL at 41.0wks as needed   Reviewed: Term labor symptoms and general obstetric precautions including but not limited to vaginal bleeding, contractions, leaking of fluid and fetal movement were reviewed in detail with the patient.  All questions were answered. Didn't ask about home bp cuff. Check bp weekly, let us know if >140/90.   Follow-up: Return in 8 days (on 02/21/2020) for LROB, in person, [name]: BPP (or NST if u/s not avail).  No orders of the defined types were placed in this encounter.  Briana Dominguez CNM 02/13/2020 2:01 PM

## 2020-02-13 NOTE — ED Triage Notes (Signed)
Pt arrives POV with contractions. MD Hyacinth Meeker to bedside. Pt actively delivering.

## 2020-02-14 ENCOUNTER — Encounter (HOSPITAL_COMMUNITY): Payer: Self-pay | Admitting: Obstetrics & Gynecology

## 2020-02-14 DIAGNOSIS — O99019 Anemia complicating pregnancy, unspecified trimester: Secondary | ICD-10-CM | POA: Diagnosis present

## 2020-02-14 LAB — CBC
HCT: 26.1 % — ABNORMAL LOW (ref 36.0–46.0)
Hemoglobin: 8 g/dL — ABNORMAL LOW (ref 12.0–15.0)
MCH: 25.4 pg — ABNORMAL LOW (ref 26.0–34.0)
MCHC: 30.7 g/dL (ref 30.0–36.0)
MCV: 82.9 fL (ref 80.0–100.0)
Platelets: 244 10*3/uL (ref 150–400)
RBC: 3.15 MIL/uL — ABNORMAL LOW (ref 3.87–5.11)
RDW: 13.3 % (ref 11.5–15.5)
WBC: 12 10*3/uL — ABNORMAL HIGH (ref 4.0–10.5)
nRBC: 0 % (ref 0.0–0.2)

## 2020-02-14 MED ORDER — FERROUS SULFATE 325 (65 FE) MG PO TABS
325.0000 mg | ORAL_TABLET | ORAL | Status: DC
  Administered 2020-02-14: 325 mg via ORAL
  Filled 2020-02-14: qty 1

## 2020-02-14 NOTE — H&P (Addendum)
OBSTETRIC ADMISSION HISTORY AND PHYSICAL  Briana Dominguez is a 20 y.o. female G2P2002 at 21 weeks who had a spontaneous vaginal delivery at the [facility] emergency department tonight around 2000. Earlier today she was seen at [facility] and had membranes swept. Patient presented to the [facility] crowning and delivered SVD with 2 pushes, no lacerations. Placenta delivered spontaneously. She plans on bottle feeding. She requests depo for birth control. She received her prenatal care at [facility]   Prenatal History/Complications:  Anatomy US with right hydronephrosis with dilated ureter, Right renal pelvis 5.2 mm Gonorrhea: positive on 08/16/19 and 09/20/19, tx'd on 08/23/19 and 10/18/19, negative TOC 11/20/19 Short interval between pregnancies: last delivery 12/14/2018  Past Medical History: Past Medical History:  Diagnosis Date  . Asthma   . Bronchospasm   . History of chlamydia   . History of gonorrhea   . Trichimoniasis 05/09/2018   Treated 4/21 with flagyl, POC___     Past Surgical History: Past Surgical History:  Procedure Laterality Date  . NO PAST SURGERIES      Obstetrical History: OB History    Gravida  2   Para  1   Term  1   Preterm      AB      Living  1     SAB      IAB      Ectopic      Multiple  0   Live Births  1           Social History Social History   Socioeconomic History  . Marital status: Single    Spouse name: [place]  . Number of children: 1  . Years of education: 4  . Highest education level: 11th grade  Occupational History  . Not on file  Tobacco Use  . Smoking status: Former Smoker    Types: Cigars    Quit date: 2019    Years since quitting: 3.0  . Smokeless tobacco: Never Used  . Tobacco comment: black & mild  Vaping Use  . Vaping Use: Former  Substance and Sexual Activity  . Alcohol use: No  . Drug use: Not Currently  . Sexual activity: Yes    Birth control/protection: None  Other Topics Concern  . Not on file  Social History  Narrative   10th grade at [employer].  Grades good.  Lives with father.  Eats meats, fruits, vegetables.  Wears seatbelt.  Denies any sexual activity.  Denies tobacco, alcohol, or drug use.   Spends weekends with her grandmother, maternal grandmother.   Does not smoke but is exposed to secondhand smoke.   Social Determinants of Health   Financial Resource Strain: Low Risk   . Difficulty of Paying Living Expenses: Not hard at all  Food Insecurity: No Food Insecurity  . Worried About Programme researcher, broadcasting/film/video in the Last Year: Never true  . Ran Out of Food in the Last Year: Never true  Transportation Needs: No Transportation Needs  . Lack of Transportation (Medical): No  . Lack of Transportation (Non-Medical): No  Physical Activity: Inactive  . Days of Exercise per Week: 0 days  . Minutes of Exercise per Session: 0 min  Stress: Stress Concern Present  . Feeling of Stress : To some extent  Social Connections: Moderately Isolated  . Frequency of Communication with Friends and Family: More than three times a week  . Frequency of Social Gatherings with Friends and Family: More than three times a  week  . Attends Religious Services: Never  . Active Member of Clubs or Organizations: No  . Attends Banker Meetings: Never  . Marital Status: Living with partner    Family History: Family History  Problem Relation Age of Onset  . Healthy Mother   . Healthy Father     Allergies: No Known Allergies  Medications Prior to Admission  Medication Sig Dispense Refill Last Dose  . acetaminophen (TYLENOL) 325 MG tablet Take 2 tablets (650 mg total) by mouth every 4 (four) hours as needed (for pain scale < 4). 60 tablet 0   . albuterol (VENTOLIN HFA) 108 (90 Base) MCG/ACT inhaler Inhale 1-2 puffs into the lungs every 6 (six) hours as needed for wheezing or shortness of breath.      . Blood Pressure Monitor MISC For regular home bp monitoring during pregnancy 1 each 0   .  ferrous sulfate 325 (65 FE) MG tablet Take 1 tablet (325 mg total) by mouth 2 (two) times daily with a meal. 60 tablet 3   . Prenatal Vit-Fe Fumarate-FA (PRENATAL VITAMINS) 28-0.8 MG TABS Take by mouth.     . promethazine (PHENERGAN) 25 MG tablet Take 1 tablet (25 mg total) by mouth every 8 (eight) hours as needed for nausea or vomiting. 30 tablet 0   . terconazole (TERAZOL 7) 0.4 % vaginal cream Place 1 applicator vaginally at bedtime. 45 g 0      Review of Systems   All systems reviewed and negative except as stated in HPI  Blood pressure 114/66, pulse 90, resp. rate 18, height 5\' 1"  (1.549 m), weight 53 kg, SpO2 100 %, unknown if currently breastfeeding. General appearance: alert, cooperative, appears stated age and no distress Abdomen: soft, non-tender Pelvic: Normal appearing external female genitalia, normal vaginal epithelium without laceration. 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, see below  Prenatal Transfer Tool  Maternal Diabetes: No Genetic Screening: Normal Maternal Ultrasounds/Referrals: Fetal Kidney Anomalies Fetal Ultrasounds or other Referrals:  Referred to Materal Fetal Medicine  Maternal Substance Abuse:  No Significant Maternal Medications:  None Significant Maternal Lab Results: Group B Strep negative  Results for orders placed or performed during the hospital encounter of 02/13/20 (from the past 24 hour(s))  SARS Coronavirus 2 by RT PCR (hospital order, performed in [facility] hospital lab) Nasopharyngeal Nasopharyngeal Swab   Collection Time: 02/13/20  8:37 PM   Specimen:  Nasopharyngeal Swab  Result Value Ref Range   SARS Coronavirus 2 NEGATIVE NEGATIVE  CBG monitoring, ED   Collection Time: 02/13/20  9:41 PM  Result Value Ref Range   Glucose-Capillary 83 70 - 99 mg/dL    Patient Active Problem List   Diagnosis Date Noted  . Vaginal delivery 02/13/2020  . Encounter for supervision of normal pregnancy, antepartum 08/16/2019  . Short interval between pregnancies affecting pregnancy, antepartum 08/16/2019  . Vitamin D deficiency 03/10/2017  . Low vitamin B12 level 03/10/2017  . Gonorrhea 03/10/2017    Assessment/Plan:  Ariele Vidrio is a 20 y.o. 26 here for post partum care.  #Postpartum: Patient delivered a viable female infant at approx 2000. Delivery was uncomplicated, no lacerations, minimal  blood loss with a spontaneously delivered placenta. Admitted for routine postpartum care. #ID:  GBS negative #MOF: bottle #MOC: depo #Infant with abnormal renal findings: Anatomy US on 09/20/19 found "right hydronephrosis with dilated ureter, right renal pelvis 5.2 mm." Follow up US on 01/01/20 with "duplicated right renal system, no evidence of double ureter, mild ureteral dilation: no further sonogram evaluation is needed, evaluate posnatally."  Shirlean Mylar, MD  02/14/2020, 1:21 AM   I saw and evaluated the patient. I agree with the findings and the plan of care as documented in the resident's note.  Casper Harrison, MD [facility] Family Medicine Fellow, [facility] for [facility], [facility]

## 2020-02-14 NOTE — Progress Notes (Signed)
Post Partum Day #1 Subjective: no complaints, up ad lib and tolerating PO; bottlefeeding; desires DMPA for contraception, prior to d/c; no s/s anemia  Objective: Blood pressure (!) 107/55, pulse 76, temperature 98.4 F (36.9 C), temperature source Oral, resp. rate 17, height 5\' 1"  (1.549 m), weight 53 kg, SpO2 100 %, unknown if currently breastfeeding.  Physical Exam:  General: alert, cooperative and no distress Lochia: appropriate Uterine Fundus: firm DVT Evaluation: No evidence of DVT seen on physical exam.  Recent Labs    02/14/20 0625  HGB 8.0*  HCT 26.1*    Assessment/Plan: Plan for discharge tomorrow  Start QOD Fe   LOS: 1 day   02/16/20 CNM 02/14/2020, 8:14 AM

## 2020-02-14 NOTE — Discharge Summary (Signed)
   Postpartum Discharge Summary      Patient Name: Briana Dominguez DOB: 11/19/2000 MRN: [id number]  Date of admission: 02/13/2020 Delivery date:02/13/2020  Delivering provider:   Date of discharge: 02/15/2020  Admitting diagnosis: Vaginal delivery [O80] Intrauterine pregnancy: Unknown     Secondary diagnosis:  Active Problems:   Short interval between pregnancies affecting pregnancy, antepartum   Vaginal delivery   Anemia affecting pregnancy  Additional problems: none    Discharge diagnosis: Term Pregnancy Delivered and Anemia                                              Post partum procedures:none Augmentation: none Complications: None  Hospital course: Onset of Labor With Vaginal Delivery      20 y.o. yo G2P1001 arrived to WC&C s/p vag del at [place] ED on 02/13/2020. Patient had an uncomplicated labor course and was close to delivery upon arrival to [facility].  Membrane Rupture Time/Date: 8:20 PM ,02/13/2020   Delivery Method:Vaginal, Spontaneous  Episiotomy:  none Lacerations:   none Patient had an uncomplicated postpartum course.  She is ambulating, tolerating a regular diet, passing flatus, and urinating well. Patient is discharged home in stable condition on 02/15/20.  Newborn Data: Birth date:02/13/2020  Birth time:8:22 PM  Gender:Female  Living status:Living living Apgars: ,  Weight:3285 g   Magnesium Sulfate received: No BMZ received: No Rhophylac:N/A MMR:N/A T-DaP:Given prenatally Flu: Yes Transfusion:No  Physical exam  Vitals:   02/14/20 1144 02/14/20 1500 02/14/20 2025 02/15/20 0515  BP: (!) 98/50 (!) 100/58 98/60 103/68  Pulse: 68 70 69 63  Resp: 16 16 15 16  Temp: 98.3 F (36.8 C) 98.6 F (37 C) 98.9 F (37.2 C) 98.4 F (36.9 C)  TempSrc: Oral Oral Oral Oral  SpO2: 100% 100% 100% 100%  Weight:      Height:       General: alert, cooperative and no distress Lochia: appropriate Uterine Fundus: firm Incision: N/A DVT Evaluation: No evidence of  DVT seen on physical exam. Negative Homan's sign. No cords or calf tenderness. No significant calf/ankle edema. Labs: Lab Results  Component Value Date   WBC 12.0 (H) 02/14/2020   HGB 8.0 (L) 02/14/2020   HCT 26.1 (L) 02/14/2020   MCV 82.9 02/14/2020   PLT 244 02/14/2020   CMP Latest Ref Rng & Units 09/19/2017  Glucose 70 - 99 mg/dL 94  BUN 4 - 18 mg/dL 9  Creatinine 0.50 - 1.00 mg/dL 0.71  Sodium 135 - 145 mmol/L 140  Potassium 3.5 - 5.1 mmol/L 4.0  Chloride 98 - 111 mmol/L 108  CO2 22 - 32 mmol/L 26  Calcium 8.9 - 10.3 mg/dL 9.2  Total Protein 6.5 - 8.1 g/dL 7.4  Total Bilirubin 0.3 - 1.2 mg/dL 1.3(H)  Alkaline Phos 47 - 119 U/L 46(L)  AST 15 - 41 U/L 16  ALT 0 - 44 U/L 13   Edinburgh Score: Edinburgh Postnatal Depression Scale Screening Tool 02/13/2020  I have been able to laugh and see the funny side of things. 0  I have looked forward with enjoyment to things. 1  I have blamed myself unnecessarily when things went wrong. 2  I have been anxious or worried for no good reason. 0  I have felt scared or panicky for no good reason. 0  Things have been getting on top of me. 1    I have been so unhappy that I have had difficulty sleeping. 0  I have felt sad or miserable. 1  I have been so unhappy that I have been crying. 1  The thought of harming myself has occurred to me. 0  Edinburgh Postnatal Depression Scale Total 6     After visit meds:  Allergies as of 02/15/2020   No Known Allergies     Medication List    STOP taking these medications   acetaminophen 325 MG tablet Commonly known as: Tylenol   albuterol 108 (90 Base) MCG/ACT inhaler Commonly known as: VENTOLIN HFA   Blood Pressure Monitor Misc   ferrous sulfate 325 (65 FE) MG tablet   Prenatal Vitamins 28-0.8 MG Tabs   promethazine 25 MG tablet Commonly known as: PHENERGAN   terconazole 0.4 % vaginal cream Commonly known as: TERAZOL 7     TAKE these medications   ibuprofen 600 MG tablet Commonly  known as: ADVIL Take 1 tablet (600 mg total) by mouth every 6 (six) hours.   medroxyPROGESTERone 150 MG/ML injection Commonly known as: DEPO-PROVERA Inject 1 mL (150 mg total) into the muscle every 3 (three) months.        Discharge home in stable condition Infant Feeding: No evidence of DVT seen on physical exam. Negative Homan's sign. No cords or calf tenderness. No significant calf/ankle edema. Infant Disposition:home with mother Discharge instruction: per After Visit Summary and Postpartum booklet. Activity: Advance as tolerated. Pelvic rest for 6 weeks.  Diet: routine diet Future Appointments: Future Appointments  Date Time Provider Department Center  03/20/2020  1:50 PM Booker, Kimberly R, CNM [facility]   Follow up Visit:  Follow-up Information    [facility] Follow up on 03/20/2020.   Specialty: Obstetrics and Gynecology Why: for postpartum checkup Contact information: [address] [place] [address] [phone]               Please schedule this patient for a In person postpartum visit in 4 weeks with the following provider: Any provider. Additional Postpartum F/U:none  Low risk pregnancy complicated by: none Delivery mode:  Vaginal, Spontaneous  Anticipated Birth Control:  PP Depo given   02/15/2020 Frances Cresenzo-Dishmon, CNM   

## 2020-02-15 ENCOUNTER — Encounter: Payer: BC Managed Care – PPO | Admitting: Medical

## 2020-02-15 DIAGNOSIS — O9903 Anemia complicating the puerperium: Secondary | ICD-10-CM | POA: Diagnosis not present

## 2020-02-15 DIAGNOSIS — D649 Anemia, unspecified: Secondary | ICD-10-CM | POA: Diagnosis not present

## 2020-02-15 MED ORDER — MEDROXYPROGESTERONE ACETATE 150 MG/ML IM SUSP: 150.0000 mg | INTRAMUSCULAR | 3 refills | Status: DC

## 2020-02-15 MED ORDER — IBUPROFEN 600 MG PO TABS: 600.0000 mg | ORAL_TABLET | Freq: Four times a day (QID) | ORAL | 0 refills | Status: DC

## 2020-02-15 NOTE — Social Work (Addendum)
MOB was referred for history of depression and anxiety.   * Referral screened out by Clinical Social Worker because none of the following criteria appear to apply:  ~ History of anxiety/depression during this pregnancy, or of post-partum depression following prior delivery. ~ Diagnosis of anxiety and/or depression within last 3 years. MOB scored a 6 on the Edinburgh Postnatal Depression Scale. OR * MOB's symptoms currently being treated with medication and/or therapy.  Please contact the Clinical Social Worker if needs arise, by MOB request, or if MOB scores greater than 9/yes to question 10 on Edinburgh Postpartum Depression Screen.  Lyvonne Cassell, LCSWA Clinical Social Work [facility]  [phone] 

## 2020-02-15 NOTE — Discharge Instructions (Signed)

## 2020-02-21 ENCOUNTER — Other Ambulatory Visit: Payer: BC Managed Care – PPO | Admitting: Women's Health

## 2020-03-12 DIAGNOSIS — N1339 Other hydronephrosis: Secondary | ICD-10-CM | POA: Diagnosis not present

## 2020-03-20 ENCOUNTER — Encounter: Payer: Self-pay | Admitting: *Deleted

## 2020-03-20 ENCOUNTER — Telehealth: Payer: BC Managed Care – PPO | Admitting: Women's Health

## 2020-04-16 ENCOUNTER — Ambulatory Visit: Payer: BC Managed Care – PPO | Admitting: Advanced Practice Midwife

## 2020-04-29 ENCOUNTER — Encounter: Payer: Self-pay | Admitting: Obstetrics & Gynecology

## 2020-04-29 ENCOUNTER — Ambulatory Visit (INDEPENDENT_AMBULATORY_CARE_PROVIDER_SITE_OTHER): Payer: BC Managed Care – PPO | Admitting: Obstetrics & Gynecology

## 2020-04-29 ENCOUNTER — Ambulatory Visit: Payer: BC Managed Care – PPO | Admitting: Obstetrics & Gynecology

## 2020-04-29 ENCOUNTER — Other Ambulatory Visit: Payer: Self-pay

## 2020-04-29 NOTE — Progress Notes (Signed)
POSTPARTUM VISIT Patient name: Briana Dominguez MRN [id number]  Date of birth: 08-03-2000 Chief Complaint:   Postpartum Care  History of Present Illness:   Briana Dominguez is a 20 y.o. G32P2002 female being seen today for a postpartum visit. She is 10wks postpartum following a spontaneous vaginal delivery at 34 gestational weeks- delivered at [facility] in ED.  Of note, for the past 1-2 weeks she has noted diffuse abdominal pain with some nausea.  Denies vomiting.  No fever or chills.  Not sexually active.  Denies abnormal discharge, itching or irritation.  Denies constipation or change in bowel habits.  No improvement with medication.  No other acute complaints   Pregnancy complicated by short interval pregnancy and anemia.  Last pap smear: not indicated  Postpartum course has been uncomplicated.  Bleeding no bleeding. Bowel function is normal. Bladder function is normal. Urinary incontinence? No, fecal incontinence? No Patient is not sexually active. Marland Kitchen   Desired contraception: Depo.     Edinburgh Postpartum Depression Screening: Negative  Edinburgh Postnatal Depression Scale - 04/29/20 1139      Edinburgh Postnatal Depression Scale:  In the Past 7 Days   I have been able to laugh and see the funny side of things. 0    I have looked forward with enjoyment to things. 1    I have blamed myself unnecessarily when things went wrong. 2    I have been anxious or worried for no good reason. 0    I have felt scared or panicky for no good reason. 0    Things have been getting on top of me. 2    I have been so unhappy that I have had difficulty sleeping. 1    I have felt sad or miserable. 1    I have been so unhappy that I have been crying. 0    The thought of harming myself has occurred to me. 0    Edinburgh Postnatal Depression Scale Total 7           Baby's course has been uncomplicated. Baby is feeding by bottle. Infant has a pediatrician/family doctor? Yes.   Review of Systems:    Pertinent items are noted in HPI Denies Abnormal vaginal discharge w/ itching/odor/irritation, headaches, visual changes, shortness of breath, chest pain, abdominal pain, severe nausea/vomiting, or problems with urination or bowel movements. Pertinent History Reviewed:  Reviewed past medical,surgical, obstetrical and family history.  Reviewed problem list, medications and allergies. OB History  Gravida Para Term Preterm AB Living  2 2 2     2   SAB IAB Ectopic Multiple Live Births        0 2    # Outcome Date GA Lbr Len/2nd Weight Sex Delivery Anes PTL Lv  2 Term 02/13/20 [redacted]w[redacted]d  7 lb 3.9 oz (3.285 kg) F Vag-Spont   LIV  1 Term 12/14/18 [redacted]w[redacted]d / 00:41 7 lb 9.2 oz (3.436 kg) M Vag-Spont EPI N LIV   Physical Assessment:   Vitals:   04/29/20 1133  BP: 110/71  Pulse: 86  Weight: 109 lb 9.6 oz (49.7 kg)  Height: 5\' 1"  (1.549 m)  Body mass index is 20.71 kg/m.       Physical Examination:   General appearance: alert, well appearing, and in no distress  Mental status: alert, oriented to person, place, and time  Skin: warm & dry   Cardiovascular: RRR  Respiratory: normal respiratory effort, no distress   Breasts: no masses, nipples unremarkable, no lymphadenopathy  Abdomen: soft, non-tender, no rebound, no guarding, no reproducible pain  Pelvic: normal external genitalia, vulva, vagina, cervix, uterus and adnexa  Extremities: no edema  Chaperone: Briana Dominguez         No results found for this or any previous visit (from the past 24 hour(s)).  Assessment & Plan:  1) Postpartum exam s/p NSVD- 02/13/20 (~10wks ago) 3) bottle feeding 4) Depression screening 5) Contraception management: continue with Depot- Discussed avoiding interpregnancy interval <41mths and recommended birth spacing of 18 months -may return to regular activity  6) Abdominal pain- no acute process appreciated -no evidence of infection -reviewed conservative management- should pain worsen or continue advised pt to  go to urgent care  Meds: No orders of the defined types were placed in this encounter.   Follow-up: Return in about 1 year (around 04/29/2021) for Annual.   No orders of the defined types were placed in this encounter.  Briana Hidalgo, DO Attending Obstetrician & Gynecologist, [facility] for [employer], [facility]

## 2020-05-07 ENCOUNTER — Other Ambulatory Visit: Payer: Self-pay

## 2020-05-07 ENCOUNTER — Ambulatory Visit (INDEPENDENT_AMBULATORY_CARE_PROVIDER_SITE_OTHER): Payer: BC Managed Care – PPO | Admitting: *Deleted

## 2020-05-07 ENCOUNTER — Telehealth: Payer: Self-pay | Admitting: Obstetrics & Gynecology

## 2020-05-07 DIAGNOSIS — Z3042 Encounter for surveillance of injectable contraceptive: Secondary | ICD-10-CM | POA: Diagnosis not present

## 2020-05-07 MED ORDER — MEDROXYPROGESTERONE ACETATE 150 MG/ML IM SUSP
150.0000 mg | Freq: Once | INTRAMUSCULAR | Status: AC
  Administered 2020-05-07: 150 mg via INTRAMUSCULAR

## 2020-05-07 NOTE — Progress Notes (Signed)
   NURSE VISIT- INJECTION  SUBJECTIVE:  Briana Dominguez is a 20 y.o. G74P2002 female here for a Depo Provera for contraception/period management. She is a GYN patient.   OBJECTIVE:  There were no vitals taken for this visit.  Appears well, in no apparent distress  Injection administered in: Left deltoid  Meds ordered this encounter  Medications  . medroxyPROGESTERone (DEPO-PROVERA) injection 150 mg    ASSESSMENT: GYN patient Depo Provera for contraception/period management PLAN: Follow-up: in 11-13 weeks for next Depo   Annamarie Dawley  05/07/2020 2:30 PM

## 2020-05-07 NOTE — Telephone Encounter (Signed)
Patient stated that her Depo hasn't been filled and has a 2:30pm appt today. Clinical staff will follow up with patient.

## 2020-05-07 NOTE — Telephone Encounter (Signed)
Pt aware she has refills on Depo and was advised to call pharmacy and have them fill prescription. Pt voiced understanding. JSY

## 2020-06-23 ENCOUNTER — Encounter: Payer: Self-pay | Admitting: Medical

## 2020-06-23 ENCOUNTER — Ambulatory Visit: Payer: Self-pay | Admitting: Adult Health

## 2020-07-02 ENCOUNTER — Encounter: Payer: Self-pay | Admitting: Medical

## 2020-07-03 ENCOUNTER — Other Ambulatory Visit: Payer: Self-pay

## 2020-07-03 ENCOUNTER — Other Ambulatory Visit (HOSPITAL_COMMUNITY)
Admission: RE | Admit: 2020-07-03 | Discharge: 2020-07-03 | Disposition: A | Discharge status: Home / Self Care | Payer: BC Managed Care – PPO | Source: Ambulatory Visit | Attending: Adult Health | Admitting: Adult Health

## 2020-07-03 ENCOUNTER — Encounter: Payer: Self-pay | Admitting: Adult Health

## 2020-07-03 ENCOUNTER — Ambulatory Visit (INDEPENDENT_AMBULATORY_CARE_PROVIDER_SITE_OTHER): Payer: BC Managed Care – PPO | Admitting: Adult Health

## 2020-07-03 VITALS — BP 131/81 | HR 90 | Ht 61.0 in | Wt 107.5 lb

## 2020-07-03 DIAGNOSIS — N921 Excessive and frequent menstruation with irregular cycle: Secondary | ICD-10-CM | POA: Diagnosis not present

## 2020-07-03 DIAGNOSIS — R109 Unspecified abdominal pain: Secondary | ICD-10-CM | POA: Insufficient documentation

## 2020-07-03 DIAGNOSIS — Z113 Encounter for screening for infections with a predominantly sexual mode of transmission: Secondary | ICD-10-CM | POA: Diagnosis not present

## 2020-07-03 DIAGNOSIS — Z3202 Encounter for pregnancy test, result negative: Secondary | ICD-10-CM | POA: Insufficient documentation

## 2020-07-03 LAB — POCT URINE PREGNANCY: Preg Test, Ur: NEGATIVE

## 2020-07-03 MED ORDER — IBUPROFEN 800 MG PO TABS: 800.0000 mg | ORAL_TABLET | Freq: Three times a day (TID) | ORAL | 1 refills | Status: DC | PRN

## 2020-07-03 MED ORDER — MEGESTROL ACETATE 40 MG PO TABS
ORAL_TABLET | ORAL | 1 refills | Status: DC
Start: 2020-07-03 — End: 2021-02-03

## 2020-07-03 NOTE — Progress Notes (Signed)
  Subjective:     Patient ID: Denton Lank, female   DOB: July 22, 2000, 20 y.o.   MRN: [id number]  HPI Janda is a 20 year old black female,single, G2P2 in complaining of vaginal bleeding on depo, vaginal irritation and cramps and headaches.   Review of Systems +bleeding on depo for last month +cramps +vaginal irritation +headaches  Reviewed past medical,surgical, social and family history. Reviewed medications and allergies.     Objective:   Physical Exam BP 131/81 (BP Location: Left Arm, Patient Position: Sitting, Cuff Size: Normal)   Pulse 90   Ht 5\' 1"  (1.549 m)   Wt 107 lb 8 oz (48.8 kg)   BMI 20.31 kg/m  UPT is negative.Skin warm and dry.Pelvic: external genitalia is normal in appearance,has irregular shaped mole left groin area,, vagina: pinkish tan discharge without odor,urethra has no lesions or masses noted, cervix:everted at os, CV swab obtained,uterus: normal size, shape and contour, non tender, no masses felt, adnexa: no masses or tenderness noted. Bladder is non tender and no masses felt.   Fall risk is low  Upstream - 07/03/20 1104       Pregnancy Intention Screening   Does the patient want to become pregnant in the next year? No    Does the patient's partner want to become pregnant in the next year? No    Would the patient like to discuss contraceptive options today? No      Contraception Wrap Up   Current Method Hormonal Injection    End Method Hormonal Injection    Contraception Counseling Provided No            Examination chaperoned by 07/05/20 LPN  Assessment:     1. Pregnancy examination or test, negative result   2. Irregular intermenstrual bleeding CV swab sent Will rx megace Meds ordered this encounter  Medications   megestrol (MEGACE) 40 MG tablet    Sig: Take 3 x 5 days then 2 x 5 days then 1 daily till bleeding    Dispense:  45 tablet    Refill:  1    Order Specific Question:   Supervising Provider    Answer:   Malachy Mood H  [[id number]]   ibuprofen (ADVIL) 800 MG tablet    Sig: Take 1 tablet (800 mg total) by mouth every 8 (eight) hours as needed.    Dispense:  60 tablet    Refill:  1    Order Specific Question:   Supervising Provider    Answer:   Duane Lope, LUTHER H [[id number]]     3. Abdominal cramps Will rx motrin   4. Screening examination for STD (sexually transmitted disease) CV sent for GC/CL,trich,BV and yeast     Plan:    Has depo scheduled for 07/24/20

## 2020-07-04 LAB — CERVICOVAGINAL ANCILLARY ONLY
Bacterial Vaginitis (gardnerella): POSITIVE — AB
Candida Glabrata: NEGATIVE
Candida Vaginitis: POSITIVE — AB
Chlamydia: NEGATIVE
Comment: NEGATIVE
Comment: NEGATIVE
Comment: NEGATIVE
Comment: NEGATIVE
Comment: NEGATIVE
Comment: NORMAL
Neisseria Gonorrhea: NEGATIVE
Trichomonas: NEGATIVE

## 2020-07-07 ENCOUNTER — Other Ambulatory Visit: Payer: Self-pay | Admitting: Adult Health

## 2020-07-07 MED ORDER — METRONIDAZOLE 500 MG PO TABS
500.0000 mg | ORAL_TABLET | Freq: Two times a day (BID) | ORAL | 0 refills | Status: DC
Start: 2020-07-07 — End: 2020-11-11

## 2020-07-07 MED ORDER — FLUCONAZOLE 150 MG PO TABS: ORAL_TABLET | ORAL | 1 refills | Status: DC

## 2020-07-07 NOTE — Progress Notes (Signed)
Rx diflucan and flagyl for +BV and yeast on vaginal swab

## 2020-07-24 ENCOUNTER — Ambulatory Visit: Payer: BC Managed Care – PPO

## 2020-07-30 ENCOUNTER — Ambulatory Visit: Payer: BC Managed Care – PPO

## 2020-08-04 ENCOUNTER — Ambulatory Visit: Payer: BC Managed Care – PPO

## 2020-08-08 ENCOUNTER — Ambulatory Visit (INDEPENDENT_AMBULATORY_CARE_PROVIDER_SITE_OTHER): Payer: BC Managed Care – PPO

## 2020-08-08 ENCOUNTER — Other Ambulatory Visit: Payer: Self-pay

## 2020-08-08 DIAGNOSIS — Z3042 Encounter for surveillance of injectable contraceptive: Secondary | ICD-10-CM | POA: Diagnosis not present

## 2020-08-08 MED ORDER — MEDROXYPROGESTERONE ACETATE 150 MG/ML IM SUSP
150.0000 mg | Freq: Once | INTRAMUSCULAR | Status: AC
  Administered 2020-08-08: 150 mg via INTRAMUSCULAR

## 2020-08-08 NOTE — Progress Notes (Signed)
   NURSE VISIT- INJECTION  SUBJECTIVE:  Briana Dominguez is a 20 y.o. G21P2002 female here for a Depo Provera for contraception/period management. She is a GYN patient.   OBJECTIVE:  There were no vitals taken for this visit.  Appears well, in no apparent distress  Injection administered in: Right deltoid  Meds ordered this encounter  Medications   medroxyPROGESTERone (DEPO-PROVERA) injection 150 mg    ASSESSMENT: GYN patient Depo Provera for contraception/period management PLAN: Follow-up: in 11-13 weeks for next Depo   Briana Dominguez A Briana Dominguez  08/08/2020 12:00 PM

## 2020-10-29 ENCOUNTER — Ambulatory Visit: Payer: BC Managed Care – PPO

## 2020-11-11 ENCOUNTER — Other Ambulatory Visit: Payer: Self-pay

## 2020-11-11 ENCOUNTER — Ambulatory Visit: Payer: BC Managed Care – PPO

## 2020-11-11 ENCOUNTER — Ambulatory Visit (INDEPENDENT_AMBULATORY_CARE_PROVIDER_SITE_OTHER): Payer: BC Managed Care – PPO | Admitting: *Deleted

## 2020-11-11 DIAGNOSIS — Z3202 Encounter for pregnancy test, result negative: Secondary | ICD-10-CM

## 2020-11-11 DIAGNOSIS — Z3042 Encounter for surveillance of injectable contraceptive: Secondary | ICD-10-CM

## 2020-11-11 LAB — POCT URINE PREGNANCY: Preg Test, Ur: NEGATIVE

## 2020-11-11 MED ORDER — MEDROXYPROGESTERONE ACETATE 150 MG/ML IM SUSP
150.0000 mg | Freq: Once | INTRAMUSCULAR | Status: AC
  Administered 2020-11-11: 150 mg via INTRAMUSCULAR

## 2020-11-11 NOTE — Progress Notes (Signed)
   NURSE VISIT- INJECTION  SUBJECTIVE:  Briana Dominguez is a 20 y.o. G32P2002 female here for a Depo Provera for contraception/period management. She is a GYN patient. Pt is a few days late getting depo. Pt stated hasn't had sex in several weeks. UPT negative in office.   OBJECTIVE:  There were no vitals taken for this visit.  Appears well, in no apparent distress  Injection administered in: Left deltoid  Meds ordered this encounter  Medications   medroxyPROGESTERone (DEPO-PROVERA) injection 150 mg    ASSESSMENT: GYN patient Depo Provera for contraception/period management PLAN: Follow-up: in 11-13 weeks for next Depo   Malachy Mood  11/11/2020 12:05 PM

## 2021-01-02 ENCOUNTER — Ambulatory Visit: Payer: BC Managed Care – PPO | Admitting: Adult Health

## 2021-02-03 ENCOUNTER — Ambulatory Visit (INDEPENDENT_AMBULATORY_CARE_PROVIDER_SITE_OTHER): Payer: Medicaid Other | Admitting: Adult Health

## 2021-02-03 ENCOUNTER — Other Ambulatory Visit: Payer: Self-pay

## 2021-02-03 ENCOUNTER — Other Ambulatory Visit (HOSPITAL_COMMUNITY)
Admission: RE | Admit: 2021-02-03 | Discharge: 2021-02-03 | Disposition: A | Discharge status: Home / Self Care | Payer: Medicaid Other | Source: Ambulatory Visit | Attending: Adult Health | Admitting: Adult Health

## 2021-02-03 ENCOUNTER — Encounter: Payer: Self-pay | Admitting: Adult Health

## 2021-02-03 ENCOUNTER — Other Ambulatory Visit: Payer: BC Managed Care – PPO

## 2021-02-03 VITALS — BP 118/76 | HR 82 | Ht 61.0 in | Wt 105.4 lb

## 2021-02-03 DIAGNOSIS — Z01419 Encounter for gynecological examination (general) (routine) without abnormal findings: Secondary | ICD-10-CM

## 2021-02-03 DIAGNOSIS — N921 Excessive and frequent menstruation with irregular cycle: Secondary | ICD-10-CM

## 2021-02-03 DIAGNOSIS — Z113 Encounter for screening for infections with a predominantly sexual mode of transmission: Secondary | ICD-10-CM

## 2021-02-03 DIAGNOSIS — Z3042 Encounter for surveillance of injectable contraceptive: Secondary | ICD-10-CM

## 2021-02-03 MED ORDER — MEDROXYPROGESTERONE ACETATE 150 MG/ML IM SUSP: 150.0000 mg | INTRAMUSCULAR | 3 refills | Status: DC

## 2021-02-03 MED ORDER — MEGESTROL ACETATE 40 MG PO TABS: ORAL_TABLET | ORAL | 1 refills | Status: DC

## 2021-02-03 NOTE — Progress Notes (Signed)
Patient ID: Briana Dominguez, female   DOB: [birth year], 21 y.o.   MRN: [id number] History of Present Illness: Briana Dominguez is a 21 year old black female,single, G2P2 in for well woman gyn exam and first pap. She is on depo and spotting, usually does when near due date.    Current Medications, Allergies, Past Medical History, Past Surgical History, Family History and Social History were reviewed in [employer] record.     Review of Systems: Patient denies any headaches, hearing loss, fatigue, blurred vision, shortness of breath, chest pain, abdominal pain, problems with bowel movements, urination, or intercourse. No joint pain or mood swings.  +BTB   Physical Exam:BP 118/76 (BP Location: Right Arm, Patient Position: Sitting, Cuff Size: Normal)    Pulse 82    Ht 5\' 1"  (1.549 m)    Wt 105 lb 6.4 oz (47.8 kg)    Breastfeeding No    BMI 19.92 kg/m   General:  Well developed, well nourished, no acute distress Skin:  Warm and dry Neck:  Midline trachea, normal thyroid, good ROM, no lymphadenopathy Lungs; Clear to auscultation bilaterally Breast:  No dominant palpable mass, retraction, or nipple discharge Cardiovascular: Regular rate and rhythm Abdomen:  Soft, non tender, no hepatosplenomegaly Pelvic:  External genitalia is normal in appearance, no lesions.  The vagina is normal in appearance,+blood. Urethra has no lesions or masses. The cervix is smooth, pap with GC/CHL performed.  Uterus is felt to be normal size, shape, and contour.  No adnexal masses or tenderness noted.Bladder is non tender, no masses felt. Rectal:Deferred Extremities/musculoskeletal:  No swelling or varicosities noted, no clubbing or cyanosis Psych:  No mood changes, alert and cooperative,seems happy AA is 0 Fall risk is low Depression screen [employer] 2/9 02/03/2021 11/20/2019 08/16/2019  Decreased Interest 1 0 0  Down, Depressed, Hopeless 2 0 0  PHQ - 2 Score 3 0 0  Altered sleeping 2 0 0  Tired, decreased  energy 2 1 3   Change in appetite 1 0 2  Feeling bad or failure about yourself  0 0 0  Trouble concentrating 0 0 0  Moving slowly or fidgety/restless 0 0 0  Suicidal thoughts 0 0 0  PHQ-9 Score 8 1 5   Difficult doing work/chores - - -    GAD 7 : Generalized Anxiety Score 02/03/2021 11/20/2019 08/16/2019  Nervous, Anxious, on Edge 2 0 0  Control/stop worrying 1 0 0  Worry too much - different things 1 1 0  Trouble relaxing 1 0 0  Restless 0 0 0  Easily annoyed or irritable 2 0 0  Afraid - awful might happen 0 0 0  Total GAD 7 Score 7 1 0    Upstream - 02/03/21 1145       Pregnancy Intention Screening   Does the patient want to become pregnant in the next year? No    Does the patient's partner want to become pregnant in the next year? No    Would the patient like to discuss contraceptive options today? No      Contraception Wrap Up   Current Method Hormonal Injection    End Method Hormonal Injection    Contraception Counseling Provided No            Examination chaperoned by 13/02/2019 LPN    Impression and Plan: 1. Encounter for gynecological examination with Papanicolaou smear of cervix Pap sent Pap in 3 years if normal Physical in 1 year  2. Screen for STD (sexually  transmitted disease) GC/CHL on pap  3. Encounter for surveillance of injectable contraceptive Will refill depo Meds ordered this encounter  Medications   megestrol (MEGACE) 40 MG tablet    Sig: Take 3 x 5 days then 2 x 5 days then 1 daily till bleeding    Dispense:  45 tablet    Refill:  1    Order Specific Question:   Supervising Provider    Answer:   Duane Lope H [[id number]]   medroxyPROGESTERone (DEPO-PROVERA) 150 MG/ML injection    Sig: Inject 1 mL (150 mg total) into the muscle every 3 (three) months.    Dispense:  1 mL    Refill:  3    Order Specific Question:   Supervising Provider    Answer:   EURE, LUTHER H [[id number]]     4. Irregular intermenstrual bleeding Will refill  megace

## 2021-02-05 LAB — CYTOLOGY - PAP
Chlamydia: POSITIVE — AB
Comment: NEGATIVE
Comment: NORMAL
Neisseria Gonorrhea: NEGATIVE

## 2021-02-06 ENCOUNTER — Encounter: Payer: Self-pay | Admitting: Adult Health

## 2021-02-06 ENCOUNTER — Other Ambulatory Visit: Payer: Self-pay | Admitting: Adult Health

## 2021-02-06 DIAGNOSIS — A749 Chlamydial infection, unspecified: Secondary | ICD-10-CM

## 2021-02-06 DIAGNOSIS — R87612 Low grade squamous intraepithelial lesion on cytologic smear of cervix (LGSIL): Secondary | ICD-10-CM

## 2021-02-06 HISTORY — DX: Chlamydial infection, unspecified: A74.9

## 2021-02-06 HISTORY — DX: Low grade squamous intraepithelial lesion on cytologic smear of cervix (LGSIL): R87.612

## 2021-02-06 MED ORDER — DOXYCYCLINE HYCLATE 100 MG PO TABS: 100.0000 mg | ORAL_TABLET | Freq: Two times a day (BID) | ORAL | 0 refills | Status: DC

## 2021-02-12 ENCOUNTER — Ambulatory Visit (INDEPENDENT_AMBULATORY_CARE_PROVIDER_SITE_OTHER): Payer: Medicaid Other | Admitting: *Deleted

## 2021-02-12 ENCOUNTER — Other Ambulatory Visit: Payer: Self-pay

## 2021-02-12 DIAGNOSIS — Z3042 Encounter for surveillance of injectable contraceptive: Secondary | ICD-10-CM | POA: Diagnosis not present

## 2021-02-12 MED ORDER — MEDROXYPROGESTERONE ACETATE 150 MG/ML IM SUSP
150.0000 mg | Freq: Once | INTRAMUSCULAR | Status: AC
  Administered 2021-02-12: 150 mg via INTRAMUSCULAR

## 2021-02-12 NOTE — Progress Notes (Signed)
° °  NURSE VISIT- INJECTION  SUBJECTIVE:  Briana Dominguez is a 21 y.o. G82P2002 female here for a Depo Provera for contraception/period management. She is a GYN patient.   OBJECTIVE:  There were no vitals taken for this visit.  Appears well, in no apparent distress  Injection administered in: Right deltoid  Meds ordered this encounter  Medications   medroxyPROGESTERone (DEPO-PROVERA) injection 150 mg    ASSESSMENT: GYN patient Depo Provera for contraception/period management PLAN: Follow-up: in 11-13 weeks for next Depo   Annamarie Dawley  02/12/2021 2:11 PM

## 2021-04-02 ENCOUNTER — Telehealth: Payer: Medicaid Other | Admitting: Women's Health

## 2021-04-02 ENCOUNTER — Other Ambulatory Visit: Payer: Self-pay

## 2021-05-07 ENCOUNTER — Ambulatory Visit: Payer: Medicaid Other

## 2021-05-08 ENCOUNTER — Ambulatory Visit (INDEPENDENT_AMBULATORY_CARE_PROVIDER_SITE_OTHER): Payer: Medicaid Other

## 2021-05-08 ENCOUNTER — Other Ambulatory Visit: Payer: Self-pay | Admitting: Adult Health

## 2021-05-08 DIAGNOSIS — Z3042 Encounter for surveillance of injectable contraceptive: Secondary | ICD-10-CM

## 2021-05-08 MED ORDER — MEDROXYPROGESTERONE ACETATE 150 MG/ML IM SUSP
150.0000 mg | Freq: Once | INTRAMUSCULAR | Status: AC
  Administered 2021-05-08: 150 mg via INTRAMUSCULAR

## 2021-05-08 MED ORDER — IBUPROFEN 800 MG PO TABS: 800.0000 mg | ORAL_TABLET | Freq: Three times a day (TID) | ORAL | 1 refills | Status: DC | PRN

## 2021-05-08 NOTE — Progress Notes (Signed)
Refilled ibuprofen

## 2021-05-08 NOTE — Progress Notes (Signed)
? ?  NURSE VISIT- INJECTION ? ?SUBJECTIVE:  ?Briana Dominguez is a 21 y.o. [phone] female here for a Depo Provera for contraception/period management. She is a GYN patient.  ? ?OBJECTIVE:  ?There were no vitals taken for this visit.  ?Appears well, in no apparent distress ? ?Injection administered in: Left deltoid ? ?Meds ordered this encounter  ?Medications  ? medroxyPROGESTERone (DEPO-PROVERA) injection 150 mg  ? ? ?ASSESSMENT: ?GYN patient Depo Provera for contraception/period management ?PLAN: ?Follow-up: in 11-13 weeks for next Depo  ? ?Makenah Karas A Quamere Mussell  ?05/08/2021 ?10:12 AM ? ?

## 2021-06-02 ENCOUNTER — Other Ambulatory Visit: Payer: Self-pay | Admitting: Adult Health

## 2021-06-02 MED ORDER — ESCITALOPRAM OXALATE 10 MG PO TABS: 10.0000 mg | ORAL_TABLET | Freq: Every day | ORAL | 2 refills | Status: AC

## 2021-06-02 NOTE — Progress Notes (Signed)
Will rx lexapro ?

## 2021-06-18 IMAGING — US US MFM OB LIMITED
1 series · 15 of 28 positions shown · non-contrast
Comparison: none

[Series 1: us mfm ob limited · 58 acquisitions, 15 frames shown]
[im 1/58]
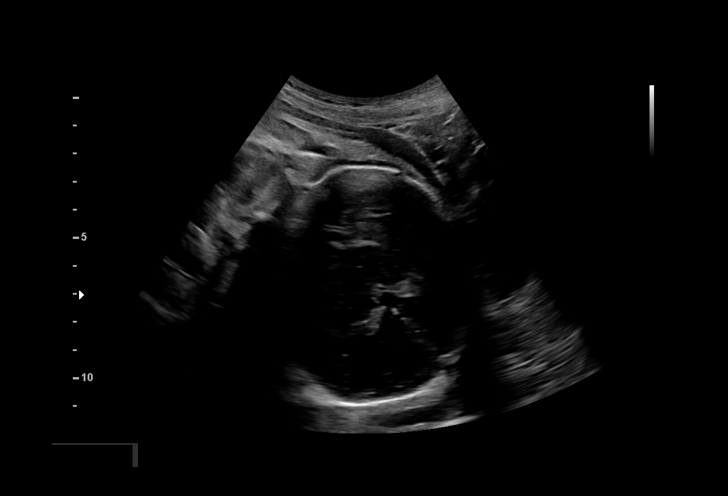
[im 5/58]
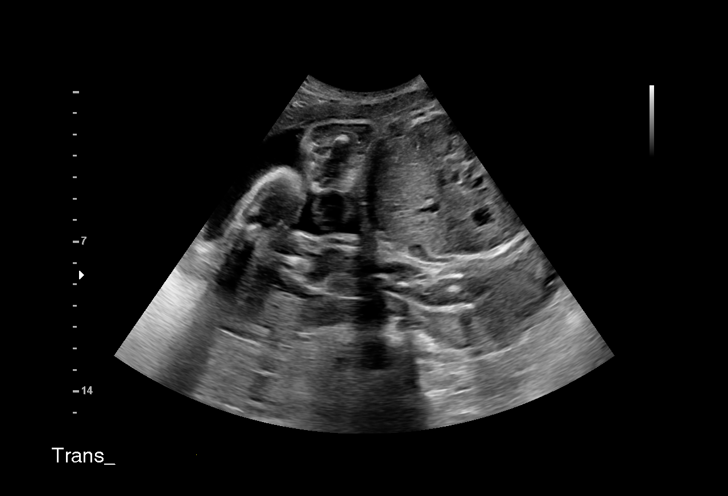
[im 9/58]
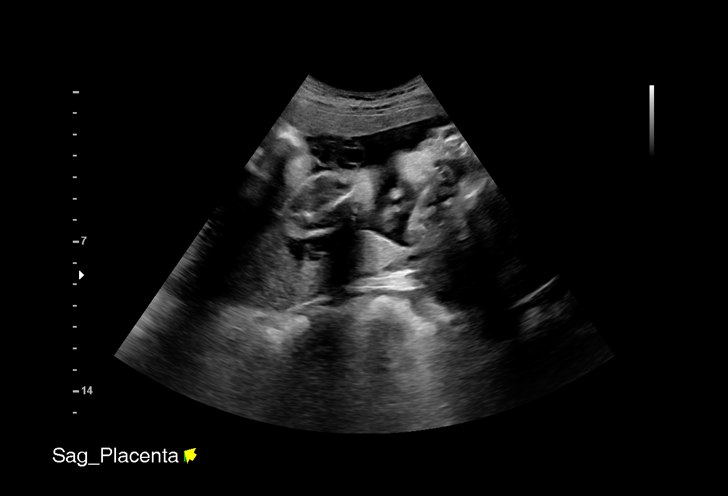
[im 13/58]
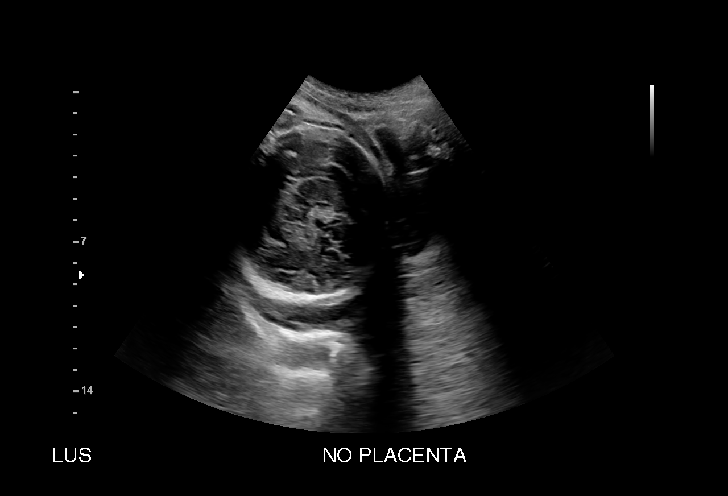
[im 17/58]
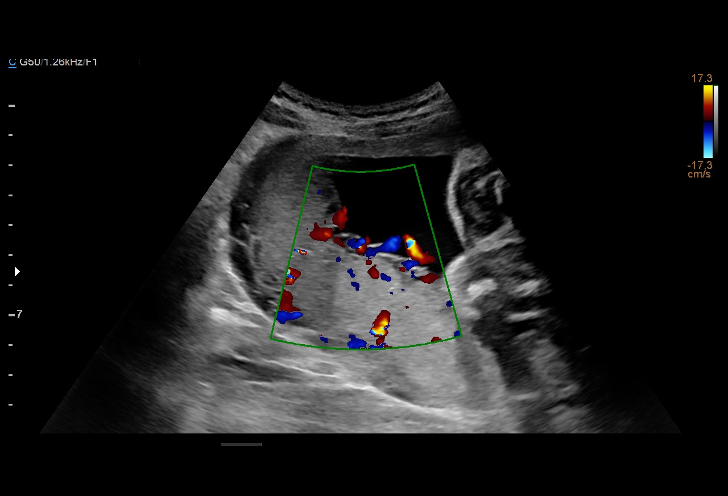
[im 22/58]
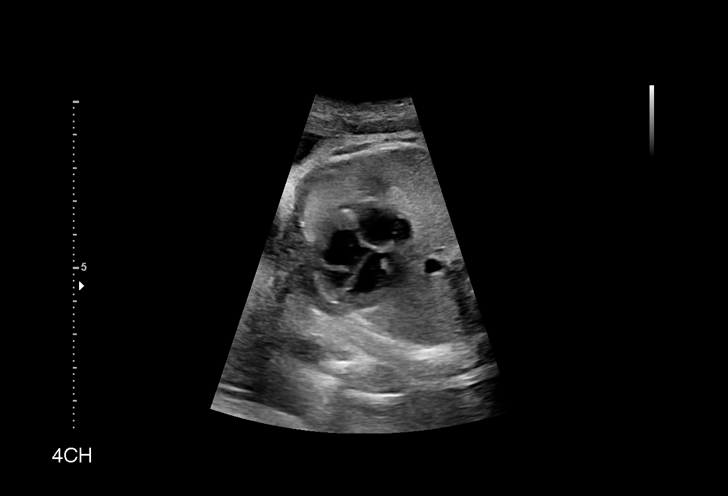
[im 26/58]
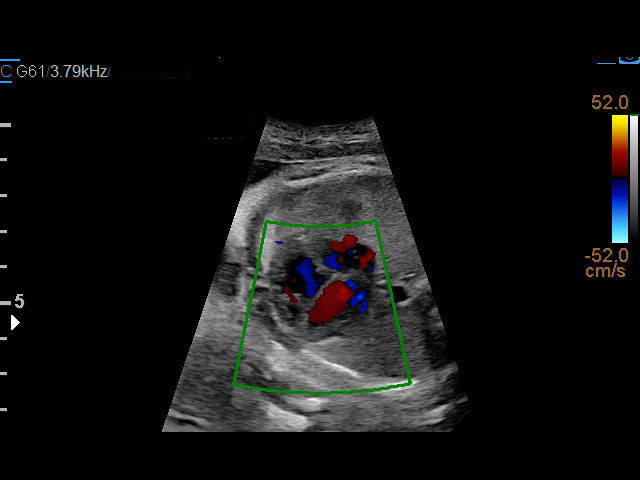
[im 30/58]
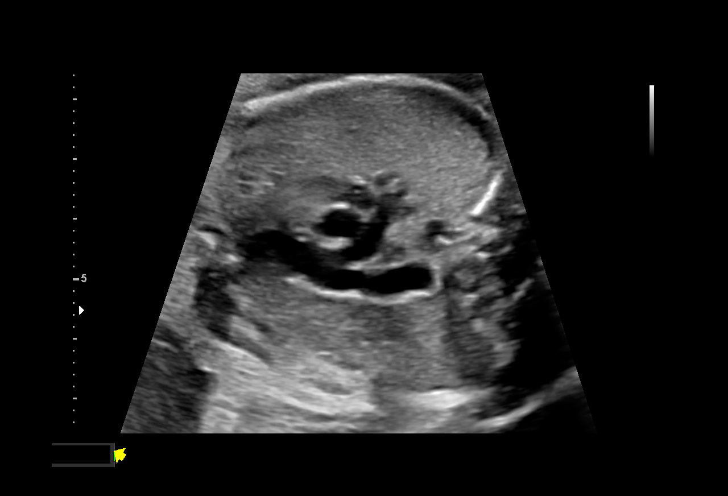
[im 32/58]
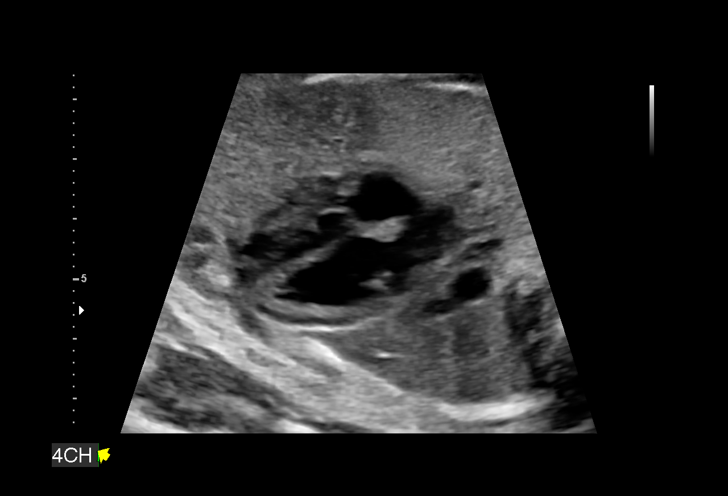
[im 36/58]
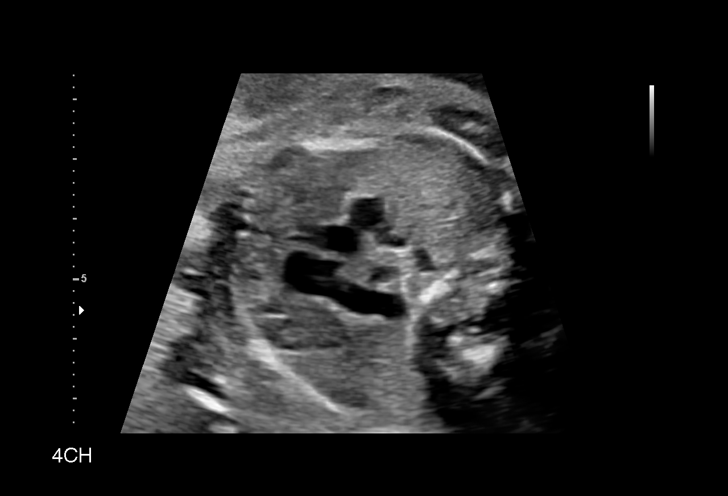
[im 41/58]
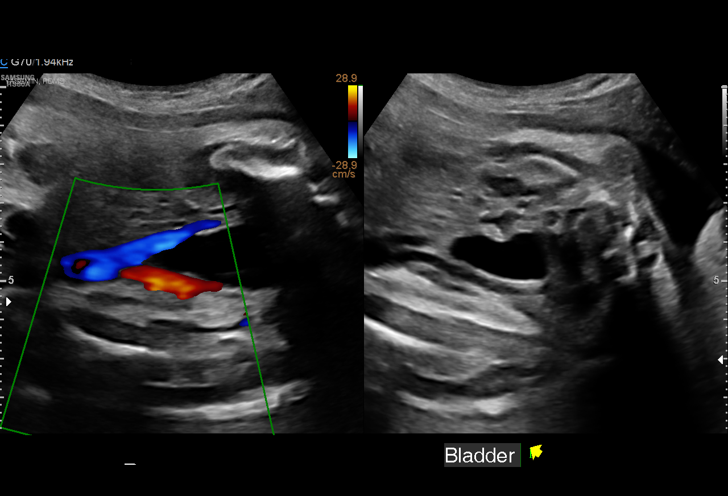
[im 45/58]
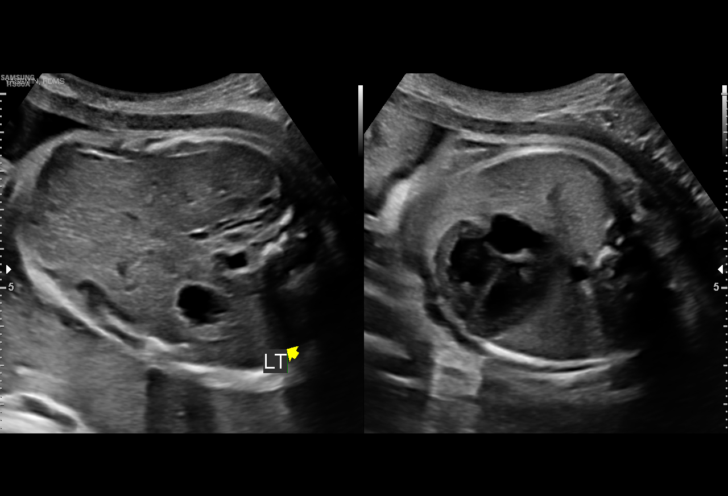
[im 49/58]
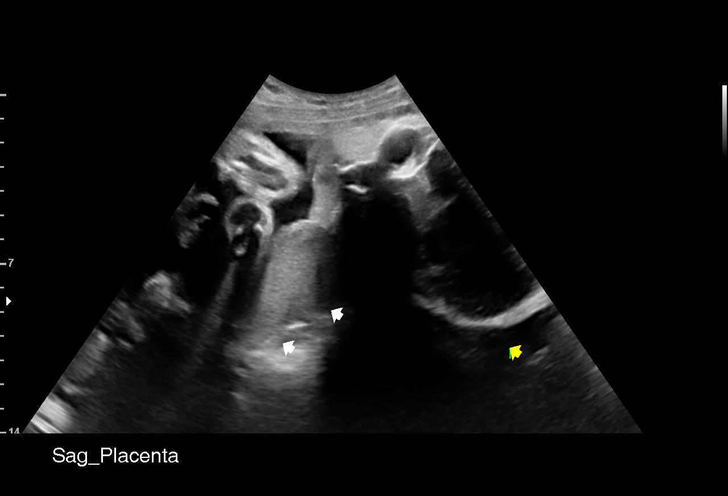
[im 53/58]
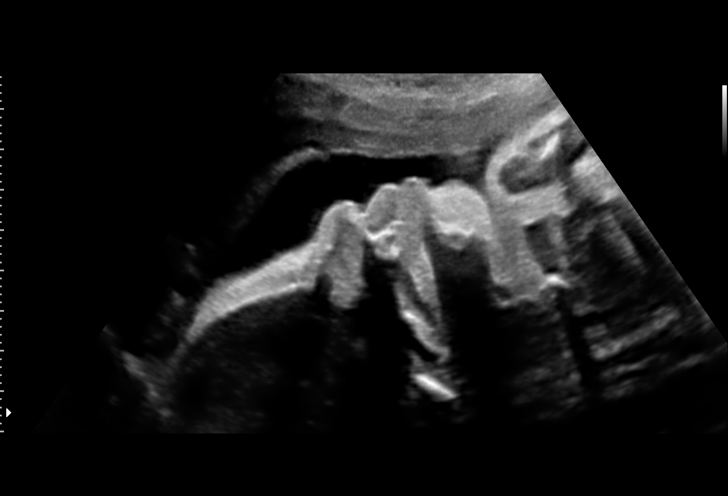
[im 58/58]
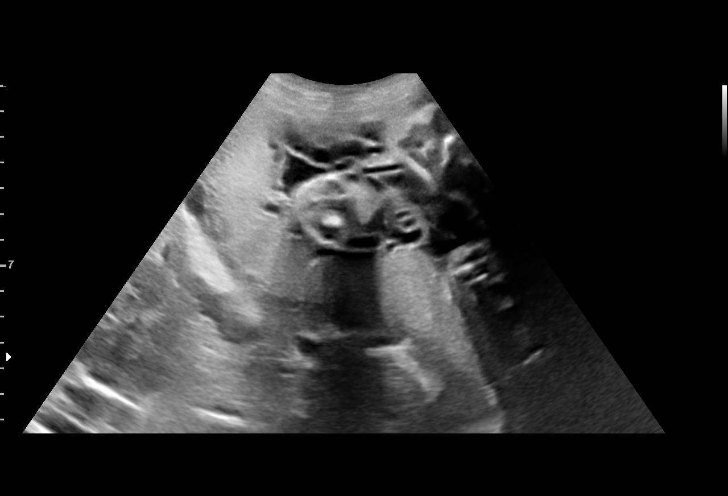

[15 of 28 positions shown; findings below may reference images not displayed]

Attending:        Vella Milner      Secondary Phy.:    [facility] Nursing-
                                                             MAU/Triage
                   CNM                                       [HOSPITAL]

 ----------------------------------------------------------------------

 ----------------------------------------------------------------------
Indications

  Low lying placenta, antepartum (US:07/18/18)
  30 weeks gestation of pregnancy
 ----------------------------------------------------------------------
Vital Signs

 BMI:
Fetal Evaluation

 Num Of Fetuses:          1
 Preg. Location:          [id number]
 Fetal Heart Rate(bpm):   144
 Cardiac Activity:        Observed
 Presentation:            Cephalic
 Placenta:                Posterior
 P. Cord Insertion:       Visualized, central

 AFI Sum(cm)     %Tile       Largest Pocket(cm)
 10.9            21

 RUQ(cm)       RLQ(cm)       LUQ(cm)        LLQ(cm)


 Comment:    No placental abruption or previa identified.
OB History

 Gravidity:    1
Gestational Age
 Best:          30w 3d     Det. By:  Early Ultrasound         EDD:   12/14/18
                                     (04/18/18)
Anatomy

 Ventricles:            Appears normal         Stomach:                Appears normal, left
                                                                       sided
 Lips:                  Appears normal         Abdominal Wall:         Appears nml (cord
                                                                       insert, abd wall)
 Heart:                 Appears normal         Cord Vessels:           Appears normal (3
                        (4CH, axis, and                                vessel cord)
                        situs)
 RVOT:                  Appears normal         Kidneys:                Appear normal
 LVOT:                  Appears normal         Bladder:                Appears normal
 Diaphragm:             Appears normal
Cervix Uterus Adnexa

 Cervix
 Not visualized (advanced GA >60wks)
Impression

 Limited exam
 No evidence of low lying placenta
Recommendations

 Follow up as clinicallyy indicated

## 2021-07-31 ENCOUNTER — Ambulatory Visit: Payer: Medicaid Other

## 2021-08-05 ENCOUNTER — Ambulatory Visit (INDEPENDENT_AMBULATORY_CARE_PROVIDER_SITE_OTHER): Payer: Medicaid Other | Admitting: *Deleted

## 2021-08-05 DIAGNOSIS — Z3042 Encounter for surveillance of injectable contraceptive: Secondary | ICD-10-CM

## 2021-08-05 MED ORDER — MEDROXYPROGESTERONE ACETATE 150 MG/ML IM SUSP
150.0000 mg | Freq: Once | INTRAMUSCULAR | Status: AC
  Administered 2021-08-05: 150 mg via INTRAMUSCULAR

## 2021-08-05 NOTE — Progress Notes (Signed)
   NURSE VISIT- INJECTION  SUBJECTIVE:  Briana Dominguez is a 21 y.o. G59P2002 female here for a Depo Provera for contraception/period management. She is a GYN patient.   OBJECTIVE:  There were no vitals taken for this visit.  Appears well, in no apparent distress  Injection administered in: Right deltoid  Meds ordered this encounter  Medications   medroxyPROGESTERone (DEPO-PROVERA) injection 150 mg    ASSESSMENT: GYN patient Depo Provera for contraception/period management PLAN: Follow-up: in 11-13 weeks for next Depo   Malachy Mood  08/05/2021 10:34 AM

## 2021-08-06 ENCOUNTER — Ambulatory Visit: Payer: Medicaid Other

## 2021-08-23 IMAGING — US US MFM OB LIMITED
1 series · 15 of 15 positions shown · non-contrast
Comparison: none

[Series 1: us mfm ob limited · 15 acquisitions, 15 frames shown]
[im 1/15]
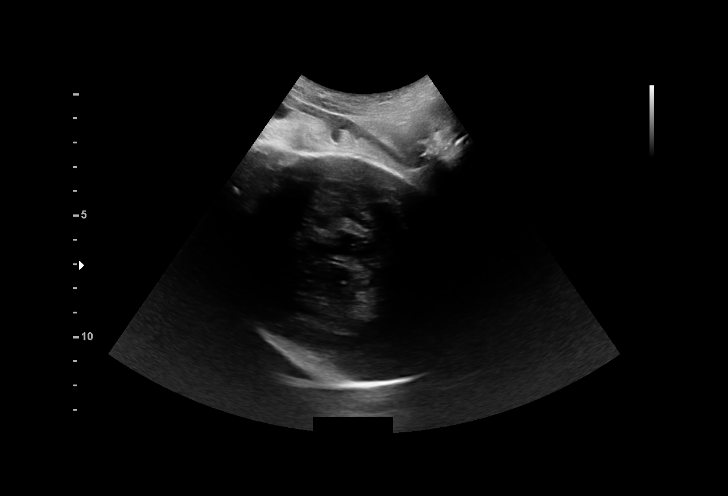
[im 2/15]
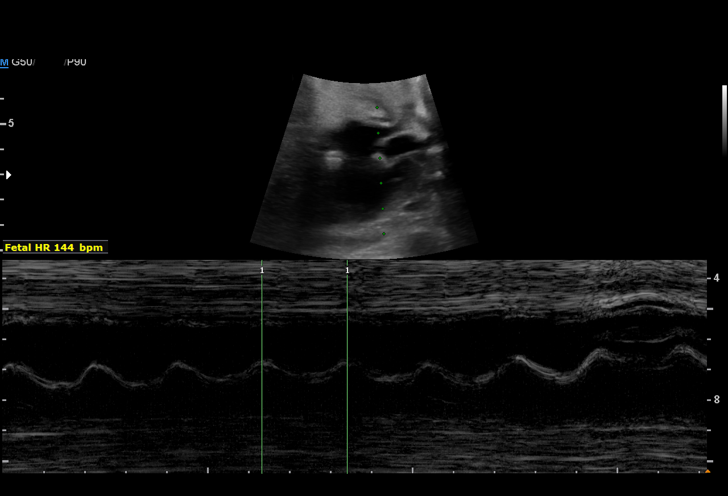
[im 3/15]
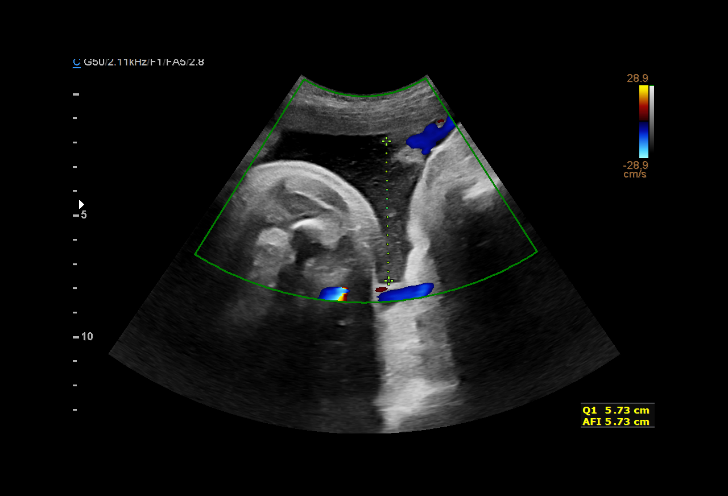
[im 4/15]
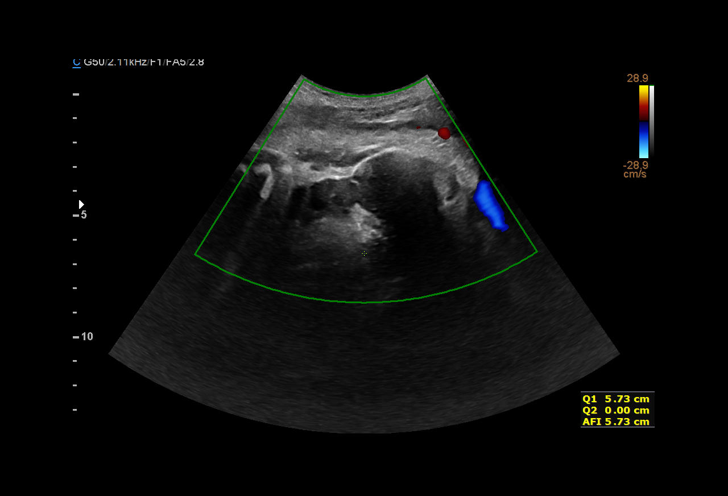
[im 5/15]
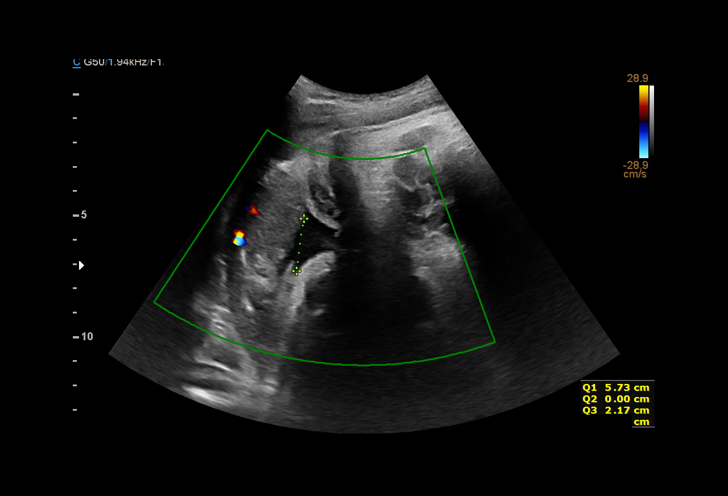
[im 6/15]
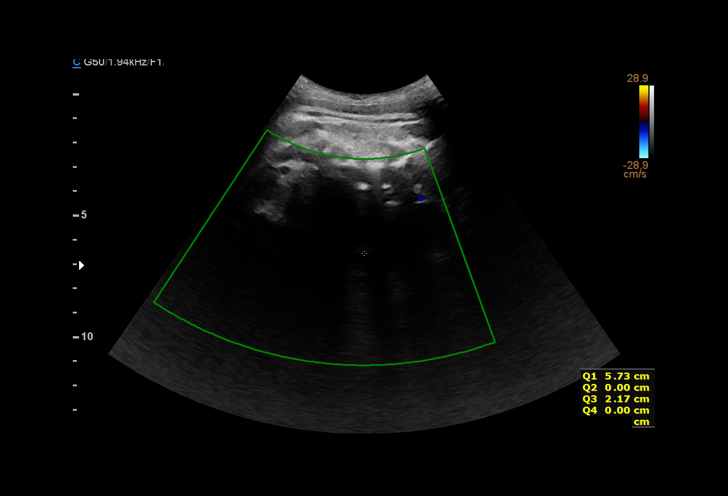
[im 7/15]
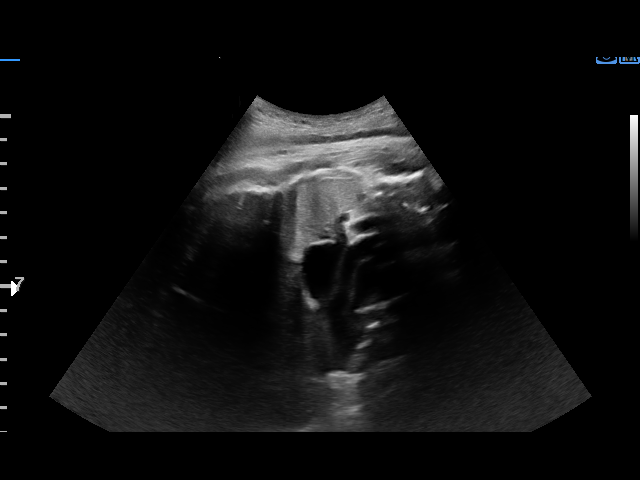
[im 8/15]
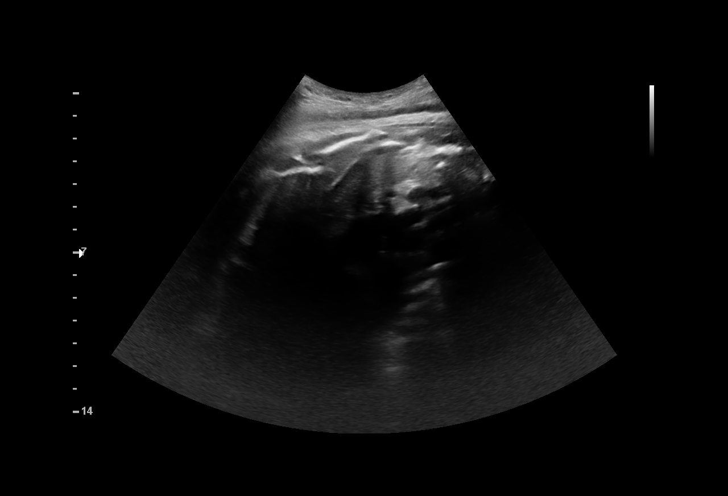
[im 9/15]
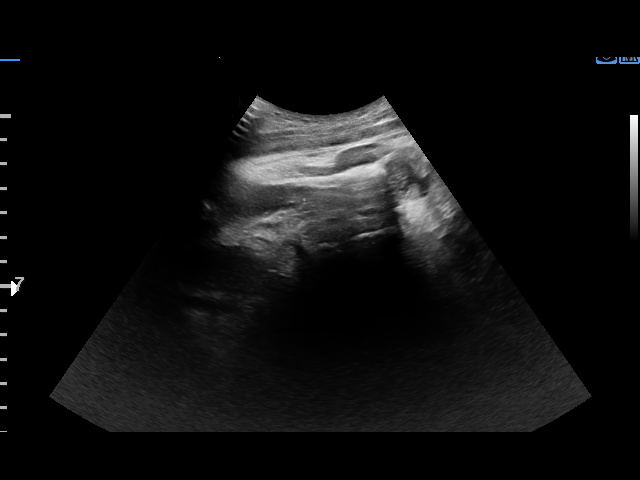
[im 10/15]
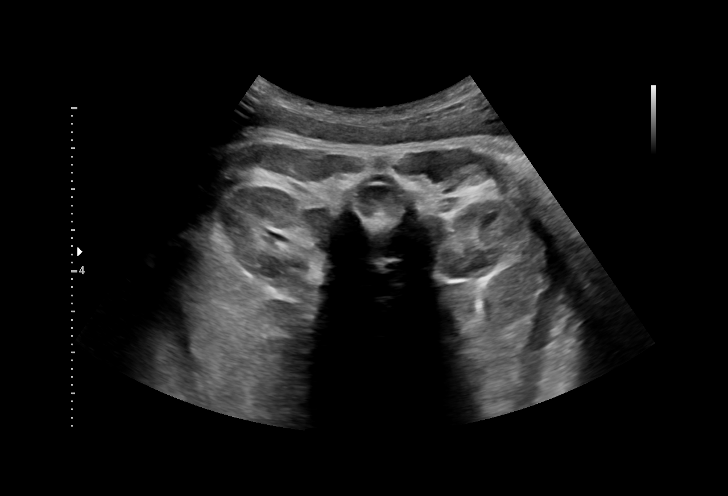
[im 11/15]
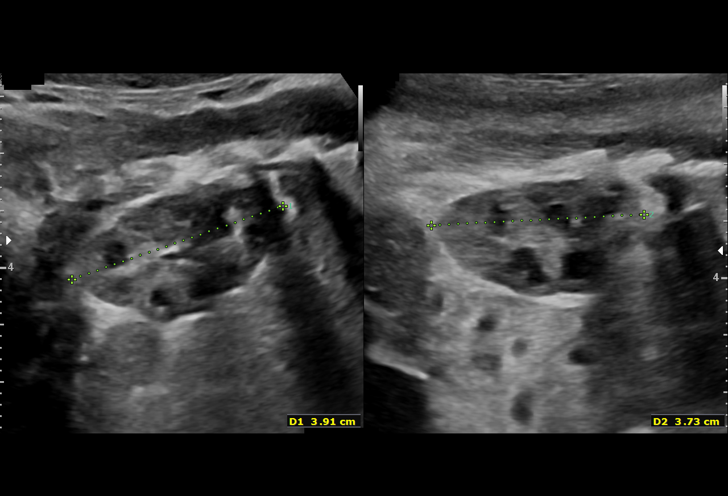
[im 12/15]
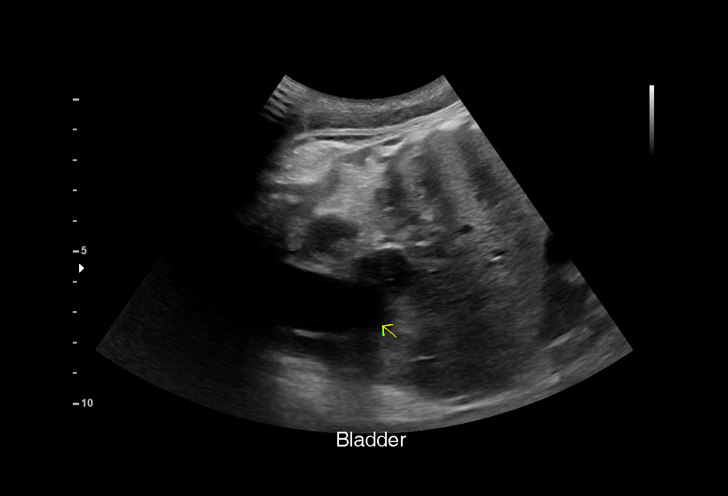
[im 13/15]
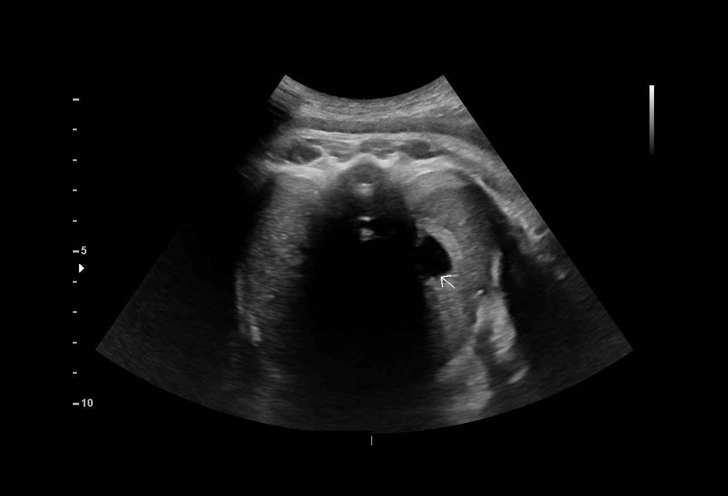
[im 14/15]
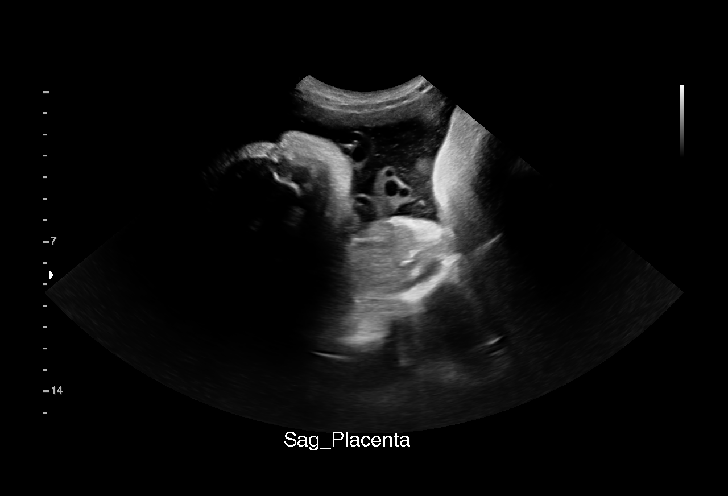
[im 15/15]
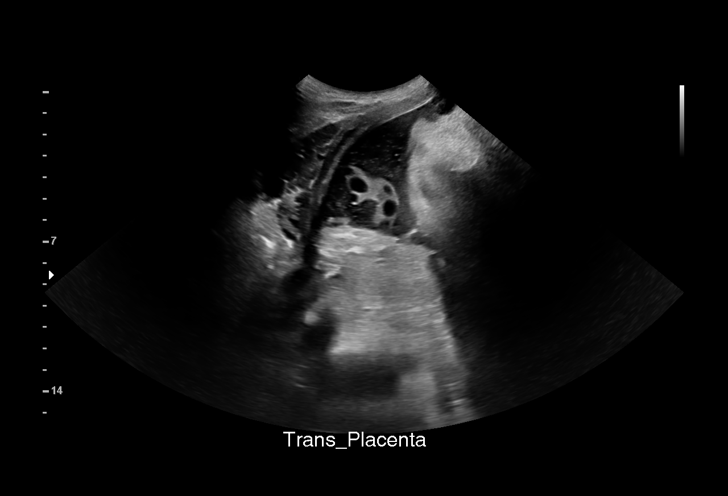

[15 of 15 positions shown; findings below may reference images not displayed]

Attending:        Matihodi Clara        Secondary Phy.:    [facility]
                                                             MAU/Triage
                   [facility]                                       [HOSPITAL]

 ----------------------------------------------------------------------

 ----------------------------------------------------------------------
Indications

  Premature rupture of membranes - leaking
  fluid
  39 weeks gestation of pregnancy
 ----------------------------------------------------------------------
Vital Signs

                                                Height:        5'1"
Fetal Evaluation

 Num Of Fetuses:          1
 Fetal Heart Rate(bpm):   144
 Cardiac Activity:        Observed
 Presentation:            Cephalic
 Placenta:                Posterior
 P. Cord Insertion:       Previously Visualized

 Amniotic Fluid
 AFI FV:      Within normal limits

 AFI Sum(cm)     %Tile       Largest Pocket(cm)
 7.9             12

 RUQ(cm)       RLQ(cm)       LUQ(cm)        LLQ(cm)
 5.73          0             0
OB History

 Gravidity:    1
Gestational Age
 Best:          39w 6d     Det. By:  Early Ultrasound         EDD:   12/14/18
                                     (04/18/18)
Anatomy

 Thoracic:              Appears normal         Abdomen:                Appears normal
 Diaphragm:             Appears normal         Kidneys:                Appear normal
 Stomach:               Appears normal, left   Bladder:                Appears normal
                        sided
Impression

 Patient was evaluated at the [facility] for suspected rupture of
 membranes.
 A limited ultrasound study was performed. Amniotic fluid is
 normal and good fetal activity is seen. Cephalic presentation.

                 Fonfara, Hans-Erich

## 2021-10-28 ENCOUNTER — Ambulatory Visit: Payer: Medicaid Other

## 2021-11-04 ENCOUNTER — Ambulatory Visit: Payer: Medicaid Other

## 2021-11-09 ENCOUNTER — Ambulatory Visit: Payer: Medicaid Other

## 2021-12-15 ENCOUNTER — Other Ambulatory Visit: Payer: Self-pay | Admitting: Adult Health

## 2021-12-15 MED ORDER — MEDROXYPROGESTERONE ACETATE 150 MG/ML IM SUSP: 150.0000 mg | INTRAMUSCULAR | 3 refills | Status: AC

## 2021-12-15 NOTE — Progress Notes (Signed)
Will rx depo 

## 2021-12-17 DIAGNOSIS — J069 Acute upper respiratory infection, unspecified: Secondary | ICD-10-CM | POA: Diagnosis not present

## 2021-12-17 DIAGNOSIS — Z20822 Contact with and (suspected) exposure to covid-19: Secondary | ICD-10-CM | POA: Diagnosis not present

## 2021-12-17 DIAGNOSIS — J309 Allergic rhinitis, unspecified: Secondary | ICD-10-CM | POA: Diagnosis not present

## 2021-12-17 DIAGNOSIS — B9789 Other viral agents as the cause of diseases classified elsewhere: Secondary | ICD-10-CM | POA: Diagnosis not present

## 2021-12-17 DIAGNOSIS — R051 Acute cough: Secondary | ICD-10-CM | POA: Diagnosis not present

## 2022-02-23 ENCOUNTER — Ambulatory Visit: Payer: Medicaid Other | Admitting: Adult Health

## 2022-03-10 ENCOUNTER — Ambulatory Visit: Payer: Medicaid Other | Admitting: Adult Health

## 2022-03-22 ENCOUNTER — Ambulatory Visit: Payer: Medicaid Other | Admitting: Adult Health

## 2022-08-04 ENCOUNTER — Encounter: Payer: Self-pay | Admitting: Advanced Practice Midwife

## 2022-08-24 ENCOUNTER — Ambulatory Visit: Payer: Medicaid Other | Admitting: Adult Health

## 2023-01-27 ENCOUNTER — Other Ambulatory Visit: Payer: Self-pay | Admitting: Adult Health

## 2023-10-21 ENCOUNTER — Ambulatory Visit: Admitting: Adult Health

## 2023-10-24 ENCOUNTER — Ambulatory Visit: Admitting: Adult Health
